# Patient Record
Sex: Female | Born: 1950 | Race: White | Hispanic: No | Marital: Married | Smoking: Former smoker
Health system: Southern US, Community
[De-identification: ages and names within clinical notes are randomized; demographics above are authoritative.]

## PROBLEM LIST (undated history)

## (undated) DIAGNOSIS — M1990 Unspecified osteoarthritis, unspecified site: Secondary | ICD-10-CM

## (undated) DIAGNOSIS — N189 Chronic kidney disease, unspecified: Secondary | ICD-10-CM

## (undated) DIAGNOSIS — R011 Cardiac murmur, unspecified: Secondary | ICD-10-CM

## (undated) DIAGNOSIS — E785 Hyperlipidemia, unspecified: Secondary | ICD-10-CM

## (undated) DIAGNOSIS — I10 Essential (primary) hypertension: Secondary | ICD-10-CM

## (undated) DIAGNOSIS — C801 Malignant (primary) neoplasm, unspecified: Secondary | ICD-10-CM

## (undated) DIAGNOSIS — M8580 Other specified disorders of bone density and structure, unspecified site: Secondary | ICD-10-CM

## (undated) DIAGNOSIS — J439 Emphysema, unspecified: Secondary | ICD-10-CM

## (undated) DIAGNOSIS — R0902 Hypoxemia: Secondary | ICD-10-CM

## (undated) DIAGNOSIS — T7840XA Allergy, unspecified, initial encounter: Secondary | ICD-10-CM

## (undated) DIAGNOSIS — K219 Gastro-esophageal reflux disease without esophagitis: Secondary | ICD-10-CM

## (undated) DIAGNOSIS — R0600 Dyspnea, unspecified: Secondary | ICD-10-CM

## (undated) HISTORY — DX: Hyperlipidemia, unspecified: E78.5

## (undated) HISTORY — DX: Chronic kidney disease, unspecified: N18.9

## (undated) HISTORY — DX: Allergy, unspecified, initial encounter: T78.40XA

## (undated) HISTORY — DX: Gastro-esophageal reflux disease without esophagitis: K21.9

## (undated) HISTORY — DX: Malignant (primary) neoplasm, unspecified: C80.1

## (undated) HISTORY — DX: Emphysema, unspecified: J43.9

## (undated) HISTORY — DX: Hypoxemia: R09.02

## (undated) HISTORY — DX: Dyspnea, unspecified: R06.00

## (undated) HISTORY — DX: Cardiac murmur, unspecified: R01.1

## (undated) HISTORY — DX: Other specified disorders of bone density and structure, unspecified site: M85.80

## (undated) HISTORY — DX: Unspecified osteoarthritis, unspecified site: M19.90

---

## 2004-11-05 DIAGNOSIS — C801 Malignant (primary) neoplasm, unspecified: Secondary | ICD-10-CM

## 2004-11-05 HISTORY — DX: Malignant (primary) neoplasm, unspecified: C80.1

## 2004-11-05 HISTORY — PX: NEPHRECTOMY: SHX65

## 2005-01-11 ENCOUNTER — Encounter: Admission: RE | Admit: 2005-01-11 | Discharge: 2005-01-11 | Payer: Self-pay | Admitting: Internal Medicine

## 2005-01-11 ENCOUNTER — Other Ambulatory Visit: Admission: RE | Admit: 2005-01-11 | Discharge: 2005-01-11 | Payer: Self-pay | Admitting: Internal Medicine

## 2005-05-07 ENCOUNTER — Ambulatory Visit: Payer: Self-pay | Admitting: Pulmonary Disease

## 2005-05-07 ENCOUNTER — Encounter: Payer: Self-pay | Admitting: Pulmonary Disease

## 2005-05-15 ENCOUNTER — Ambulatory Visit: Payer: Self-pay | Admitting: Pulmonary Disease

## 2005-05-15 ENCOUNTER — Encounter: Payer: Self-pay | Admitting: Pulmonary Disease

## 2005-05-16 ENCOUNTER — Ambulatory Visit: Payer: Self-pay

## 2005-05-30 ENCOUNTER — Ambulatory Visit: Payer: Self-pay | Admitting: Pulmonary Disease

## 2005-06-07 ENCOUNTER — Ambulatory Visit: Payer: Self-pay | Admitting: Internal Medicine

## 2005-06-12 ENCOUNTER — Ambulatory Visit: Payer: Self-pay | Admitting: Cardiology

## 2005-06-12 ENCOUNTER — Inpatient Hospital Stay (HOSPITAL_BASED_OUTPATIENT_CLINIC_OR_DEPARTMENT_OTHER): Admission: RE | Admit: 2005-06-12 | Discharge: 2005-06-12 | Payer: Self-pay | Admitting: Cardiology

## 2005-06-21 ENCOUNTER — Ambulatory Visit: Payer: Self-pay | Admitting: Internal Medicine

## 2005-07-19 ENCOUNTER — Ambulatory Visit: Payer: Self-pay | Admitting: Internal Medicine

## 2005-10-04 ENCOUNTER — Ambulatory Visit: Payer: Self-pay | Admitting: Internal Medicine

## 2005-10-18 ENCOUNTER — Ambulatory Visit: Payer: Self-pay | Admitting: Internal Medicine

## 2005-11-05 HISTORY — PX: URETHERAL RE-IMPLANTATION: SHX2616

## 2006-01-28 ENCOUNTER — Ambulatory Visit: Payer: Self-pay | Admitting: Internal Medicine

## 2006-10-03 ENCOUNTER — Other Ambulatory Visit: Admission: RE | Admit: 2006-10-03 | Discharge: 2006-10-03 | Payer: Self-pay | Admitting: Internal Medicine

## 2007-05-05 ENCOUNTER — Ambulatory Visit: Payer: Self-pay | Admitting: Cardiology

## 2007-05-08 ENCOUNTER — Ambulatory Visit: Admission: RE | Admit: 2007-05-08 | Discharge: 2007-05-08 | Payer: Self-pay | Admitting: Cardiology

## 2007-05-15 ENCOUNTER — Ambulatory Visit: Payer: Self-pay | Admitting: Pulmonary Disease

## 2007-05-22 ENCOUNTER — Ambulatory Visit: Payer: Self-pay | Admitting: Cardiology

## 2007-06-25 ENCOUNTER — Ambulatory Visit: Payer: Self-pay | Admitting: Cardiology

## 2007-07-10 ENCOUNTER — Encounter (HOSPITAL_COMMUNITY): Admission: RE | Admit: 2007-07-10 | Discharge: 2007-10-06 | Payer: Self-pay | Admitting: Pulmonary Disease

## 2007-07-18 ENCOUNTER — Ambulatory Visit: Payer: Self-pay | Admitting: Cardiology

## 2007-07-22 ENCOUNTER — Inpatient Hospital Stay (HOSPITAL_BASED_OUTPATIENT_CLINIC_OR_DEPARTMENT_OTHER): Admission: RE | Admit: 2007-07-22 | Discharge: 2007-07-22 | Payer: Self-pay | Admitting: Cardiology

## 2007-07-22 ENCOUNTER — Ambulatory Visit: Payer: Self-pay | Admitting: Cardiology

## 2007-07-23 ENCOUNTER — Ambulatory Visit: Payer: Self-pay | Admitting: Pulmonary Disease

## 2007-07-29 ENCOUNTER — Ambulatory Visit: Payer: Self-pay

## 2007-07-29 ENCOUNTER — Encounter: Payer: Self-pay | Admitting: Cardiology

## 2007-08-05 ENCOUNTER — Ambulatory Visit: Payer: Self-pay | Admitting: Pulmonary Disease

## 2007-08-05 LAB — CONVERTED CEMR LAB
BUN: 22 mg/dL (ref 6–23)
CO2: 28 meq/L (ref 19–32)
Calcium: 9.7 mg/dL (ref 8.4–10.5)
Chloride: 105 meq/L (ref 96–112)
Creatinine, Ser: 0.9 mg/dL (ref 0.4–1.2)
GFR calc Af Amer: 83 mL/min
GFR calc non Af Amer: 69 mL/min
Glucose, Bld: 98 mg/dL (ref 70–99)
Potassium: 4 meq/L (ref 3.5–5.1)
Sodium: 140 meq/L (ref 135–145)

## 2007-08-06 ENCOUNTER — Encounter: Payer: Self-pay | Admitting: Pulmonary Disease

## 2007-08-06 ENCOUNTER — Ambulatory Visit: Payer: Self-pay | Admitting: Cardiology

## 2007-08-13 ENCOUNTER — Encounter: Admission: RE | Admit: 2007-08-13 | Discharge: 2007-08-13 | Payer: Self-pay | Admitting: Internal Medicine

## 2007-08-15 ENCOUNTER — Encounter: Payer: Self-pay | Admitting: Endocrinology

## 2007-08-15 ENCOUNTER — Ambulatory Visit: Payer: Self-pay | Admitting: Endocrinology

## 2007-08-15 DIAGNOSIS — E042 Nontoxic multinodular goiter: Secondary | ICD-10-CM | POA: Insufficient documentation

## 2007-08-17 DIAGNOSIS — E785 Hyperlipidemia, unspecified: Secondary | ICD-10-CM | POA: Insufficient documentation

## 2007-08-17 DIAGNOSIS — J309 Allergic rhinitis, unspecified: Secondary | ICD-10-CM | POA: Insufficient documentation

## 2007-08-17 DIAGNOSIS — I10 Essential (primary) hypertension: Secondary | ICD-10-CM | POA: Insufficient documentation

## 2007-10-07 ENCOUNTER — Encounter (HOSPITAL_COMMUNITY): Admission: RE | Admit: 2007-10-07 | Discharge: 2007-11-04 | Payer: Self-pay | Admitting: Pulmonary Disease

## 2007-10-28 ENCOUNTER — Telehealth: Payer: Self-pay | Admitting: Pulmonary Disease

## 2007-11-04 ENCOUNTER — Encounter: Payer: Self-pay | Admitting: Pulmonary Disease

## 2007-11-06 ENCOUNTER — Encounter (HOSPITAL_COMMUNITY): Admission: RE | Admit: 2007-11-06 | Discharge: 2007-12-05 | Payer: Self-pay | Admitting: Pulmonary Disease

## 2007-11-10 ENCOUNTER — Telehealth (INDEPENDENT_AMBULATORY_CARE_PROVIDER_SITE_OTHER): Payer: Self-pay | Admitting: *Deleted

## 2007-11-12 ENCOUNTER — Telehealth: Payer: Self-pay | Admitting: Pulmonary Disease

## 2007-12-18 ENCOUNTER — Ambulatory Visit: Payer: Self-pay

## 2008-05-28 ENCOUNTER — Other Ambulatory Visit: Admission: RE | Admit: 2008-05-28 | Discharge: 2008-05-28 | Payer: Self-pay | Admitting: Internal Medicine

## 2008-08-16 ENCOUNTER — Ambulatory Visit: Payer: Self-pay | Admitting: Internal Medicine

## 2008-09-02 ENCOUNTER — Ambulatory Visit: Payer: Self-pay | Admitting: Cardiology

## 2008-10-07 ENCOUNTER — Ambulatory Visit: Payer: Self-pay | Admitting: Internal Medicine

## 2008-12-06 ENCOUNTER — Ambulatory Visit: Payer: Self-pay | Admitting: Internal Medicine

## 2009-01-31 DIAGNOSIS — I447 Left bundle-branch block, unspecified: Secondary | ICD-10-CM | POA: Insufficient documentation

## 2009-01-31 DIAGNOSIS — J439 Emphysema, unspecified: Secondary | ICD-10-CM | POA: Insufficient documentation

## 2009-01-31 DIAGNOSIS — I2510 Atherosclerotic heart disease of native coronary artery without angina pectoris: Secondary | ICD-10-CM | POA: Insufficient documentation

## 2009-04-11 ENCOUNTER — Encounter (INDEPENDENT_AMBULATORY_CARE_PROVIDER_SITE_OTHER): Payer: Self-pay | Admitting: *Deleted

## 2009-04-21 ENCOUNTER — Ambulatory Visit: Payer: Self-pay | Admitting: Cardiology

## 2009-06-02 ENCOUNTER — Ambulatory Visit: Payer: Self-pay | Admitting: Internal Medicine

## 2009-06-02 ENCOUNTER — Encounter: Payer: Self-pay | Admitting: Cardiology

## 2009-06-02 ENCOUNTER — Other Ambulatory Visit: Admission: RE | Admit: 2009-06-02 | Discharge: 2009-06-02 | Payer: Self-pay | Admitting: Internal Medicine

## 2009-06-10 ENCOUNTER — Ambulatory Visit: Payer: Self-pay | Admitting: Internal Medicine

## 2009-07-08 ENCOUNTER — Ambulatory Visit: Payer: Self-pay | Admitting: Internal Medicine

## 2009-09-13 ENCOUNTER — Ambulatory Visit: Payer: Self-pay | Admitting: Internal Medicine

## 2009-12-02 ENCOUNTER — Ambulatory Visit: Payer: Self-pay | Admitting: Internal Medicine

## 2010-01-09 ENCOUNTER — Ambulatory Visit: Payer: Self-pay | Admitting: Internal Medicine

## 2010-01-11 ENCOUNTER — Encounter: Admission: RE | Admit: 2010-01-11 | Discharge: 2010-01-11 | Payer: Self-pay | Admitting: Internal Medicine

## 2010-04-13 ENCOUNTER — Ambulatory Visit: Payer: Self-pay | Admitting: Cardiology

## 2010-06-02 ENCOUNTER — Ambulatory Visit: Payer: Self-pay | Admitting: Internal Medicine

## 2010-06-20 ENCOUNTER — Ambulatory Visit: Payer: Self-pay | Admitting: Internal Medicine

## 2010-09-07 ENCOUNTER — Ambulatory Visit: Payer: Self-pay | Admitting: Internal Medicine

## 2010-10-19 ENCOUNTER — Ambulatory Visit: Payer: Self-pay | Admitting: Internal Medicine

## 2010-12-04 ENCOUNTER — Ambulatory Visit: Admit: 2010-12-04 | Payer: Self-pay | Admitting: Internal Medicine

## 2010-12-04 ENCOUNTER — Ambulatory Visit
Admission: RE | Admit: 2010-12-04 | Discharge: 2010-12-04 | Payer: Self-pay | Source: Home / Self Care | Attending: Internal Medicine | Admitting: Internal Medicine

## 2010-12-05 NOTE — Assessment & Plan Note (Signed)
Summary: [name]   Visit Type:  1 yr f/u Referring Provider:  baxley Primary Provider:  [name] Baxely  CC:  sob...edema/feet....denies any cp.  History of Present Illness: Doing well.  No chest pain.  When she returned from her last trip, he legs and feet swelled, and it occurred on both sides.  The swelling finally went away.  Both legs got someone hot, the right leg worse than the left.  This occured about the end of May.  She did walk around the plan.  She does take her exercise bands in her seat to prevent trouble.  No chest pain with any of this.    Current Medications (verified): 1)  Atenolol 25 Mg Tabs (Atenolol) .... Take 1 Tablet By Mouth Once A Day 2)  Spiriva Handihaler 18 Mcg  Caps (Tiotropium Bromide Monohydrate) .... Inhale Contents of 1 Capsule Once A Day 3)  Hydrochlorothiazide 12.5 Mg Tabs (Hydrochlorothiazide) .... Take One Tablet By Mouth Daily. 4)  Excedrin Extra Strength 250-250-65 Mg Tabs (Aspirin-Acetaminophen-Caffeine) .... Take 1 Tablet By Mouth Once A Day 5)  Trimethoprim 100 Mg Tabs (Trimethoprim) .... Take 1 Tablet By Mouth Once A Day 6)  Nasacort Aq 55 Mcg/act Aers (Triamcinolone Acetonide(Nasal)) .... Once A Day 7)  Advair Diskus 250-50 Mcg/dose Misc (Fluticasone-Salmeterol) .... One Puff Two Times A Day 8)  Zyrtec Hives Relief 10 Mg Tabs (Cetirizine Hcl) .... Take 1 Tablet By Mouth Once A Day 9)  Valtrex 500 Mg Tabs (Valacyclovir Hcl) .... Take 1 Tablet By Mouth Once A Day At Bedtime 10)  Coleus Forskalin .... Take 1 Capsule By Mouth Three Times A Day 11)  Vagifen Supposit. .... Every 3 Days 12)  Crestor 20 Mg Tabs (Rosuvastatin Calcium) .... Take One Tablet By Mouth Daily. 13)  Promethazine Hcl 25 Mg Tabs (Promethazine Hcl) .... As Needed 14)  Mucinex Dm 30-600 Mg Xr12h-Tab (Dextromethorphan-Guaifenesin) .... As Needed 15)  Nitroglycerin 0.4 Mg Subl (Nitroglycerin) .... One Tablet Under Tongue Every 5 Minutes As Needed For Chest Pain---May Repeat Times  Three 16)  Levaquin 500 Mg Tabs (Levofloxacin) .... As Needed 17)  Benadryl 50 Mg/ml Soln (Diphenhydramine Hcl) .... As Needed 18)  Glycolax  Powd (Polyethylene Glycol 3350) .... 1/2 -1 Cap Once Daily  Allergies: No Known Drug Allergies  Past History:  Past Medical History: Last updated: 01/31/2009 CHEST PAIN-UNSPECIFIED (ICD-786.50) DYSPNEA (ICD-786.05) LBBB (ICD-426.3) CAD, UNSPECIFIED SITE (ICD-414.00) HYPERTENSION (ICD-401.9) HYPERLIPIDEMIA (ICD-272.4) HEADACHE (ICD-784.0) GOITER, NONTOXIC MULTINODULAR (ICD-241.1) COPD (ICD-496) ASTHMA (ICD-493.90) ALLERGIC RHINITIS (ICD-477.9)    Family History: Last updated: 01/31/2009 mother: hypothyroid Family History of Cancer:   Social History: Last updated: 01/31/2009 retired married Tobacco Use - No.  Alcohol Use - yes Regular Exercise - yes Drug Use - no  Vital Signs:  Patient profile:   60 year old female Height:      63 inches Weight:      154 pounds BMI:     27.38 Pulse rate:   66 / minute Pulse rhythm:   irregular BP sitting:   148 / 86  (left arm) Cuff size:   large  Vitals Entered By: Danielle Rankin, CMA (April 13, 2010 10:13 AM)  Physical Exam  General:  Well developed, well nourished, in no acute distress. Head:  normocephalic and atraumatic Eyes:  PERRLA/EOM intact; conjunctiva and lids normal. Lungs:  Clear bilaterally to auscultation and percussion. Heart:  PMI non displaced.  Normal S1.  Paradoxic S2.  No DM. Pulses:  pulses normal in all 4  extremities Extremities:  Trace edema.  No evidence of DVT. Neurologic:  Alert and oriented x 3.   Cardiac Cath  Procedure date:  07/22/2007  Findings:       ANGIOGRAPHIC DATA.:   1. Ventriculography in the RAO projection reveals vigorous global       systolic function.  Ejection fraction estimated in the range of       60%.   2. The right coronary is a small nondominant vessel.  It does not       appear to have significant focal obstruction.   3. The  left main coronary artery is free of critical disease.  It       divides into a large left anterior descending artery and circumflex       vessel.   4. The left anterior descending artery has some mild proximal luminal       irregularity.  This is perhaps slightly less prominent than on the       previous study.  There is a small first diagonal branch that has       perhaps some mild ostial narrowing, but again compared to the       previous study seems to be less prominent.  There is a mid area of       stenosis in the LAD.  This measures about 30-40% luminal reduction.       It does not appear to be critical.   5. The circumflex is a dominant vessel.  It tapers proximally into the       first marginal branch.  The ostium of the first marginal branch has       about 40% narrowing.  It does not appear to be flow limiting, but       clearly demonstrates eccentric plaquing proximally.  This distal       vessel is consistent with a bifurcating marginal and is free of       critical disease. Distally, the circumflex then provides a       posterolateral branch. The posterolateral branch does not have       significant focal narrowing.  The mid portion of the posterior       descending branch probably has about 40-50% narrowing as well, and       this is best seen in the spider view but again does not appear to       be critical.      CONCLUSION:   1. Preserved left ventricular function.   2. Mild plaquing in the mid left anterior descending.   3. Mild to moderate stenosis of the proximal marginal branch and mild       plaquing of the posterior descending branch.      DISPOSITION:   EKG  Procedure date:  04/13/2010  Findings:      NSR.  IVCD. LBBB morphology.  Impression & Recommendations:  Problem # 1:  CHEST PAIN-UNSPECIFIED (ICD-786.50) No recurrence.  Doing well.  Problem # 2:  HYPERLIPIDEMIA (ICD-272.4) Followed by Dr. [name]. Her updated medication list for this problem  includes:    Crestor 20 Mg Tabs (Rosuvastatin calcium) .Marland Kitchen... Take one tablet by mouth daily.  Problem # 3:  LBBB (ICD-426.3) Chronic  Problem # 4:  HYPERTENSION (ICD-401.9) Control may be variable. Scheduled to see Dr. [name] in next couple of months for physicial.  Asked her to record BPs every other day, and take them in at the appointment.  May want  to revisit use of atenolol.   The following medications were removed from the medication list:    Atenolol 25 Mg Tabs (Atenolol) .Marland Kitchen... Take 1 tablet by mouth once a day Her updated medication list for this problem includes:    Atenolol 25 Mg Tabs (Atenolol) .Marland Kitchen... Take 1 tablet by mouth once a day    Hydrochlorothiazide 12.5 Mg Tabs (Hydrochlorothiazide) .Marland Kitchen... Take one tablet by mouth daily.  Patient Instructions: 1)  Your physician recommends that you schedule a follow-up appointment in: one year

## 2010-12-07 ENCOUNTER — Ambulatory Visit: Payer: Self-pay | Admitting: Internal Medicine

## 2010-12-25 ENCOUNTER — Ambulatory Visit (INDEPENDENT_AMBULATORY_CARE_PROVIDER_SITE_OTHER): Payer: BC Managed Care – PPO | Admitting: Internal Medicine

## 2010-12-25 DIAGNOSIS — J209 Acute bronchitis, unspecified: Secondary | ICD-10-CM

## 2010-12-28 ENCOUNTER — Inpatient Hospital Stay (HOSPITAL_COMMUNITY): Payer: BC Managed Care – PPO

## 2010-12-28 ENCOUNTER — Ambulatory Visit: Payer: BC Managed Care – PPO | Admitting: Internal Medicine

## 2010-12-28 ENCOUNTER — Inpatient Hospital Stay (HOSPITAL_COMMUNITY)
Admission: AD | Admit: 2010-12-28 | Discharge: 2011-01-05 | DRG: 088 | Disposition: A | Discharge status: Home Health | Payer: BC Managed Care – PPO | Source: Ambulatory Visit | Attending: Internal Medicine | Admitting: Internal Medicine

## 2010-12-28 DIAGNOSIS — Z87440 Personal history of urinary (tract) infections: Secondary | ICD-10-CM

## 2010-12-28 DIAGNOSIS — J988 Other specified respiratory disorders: Secondary | ICD-10-CM | POA: Diagnosis present

## 2010-12-28 DIAGNOSIS — J209 Acute bronchitis, unspecified: Principal | ICD-10-CM | POA: Diagnosis present

## 2010-12-28 DIAGNOSIS — N390 Urinary tract infection, site not specified: Secondary | ICD-10-CM

## 2010-12-28 DIAGNOSIS — B9789 Other viral agents as the cause of diseases classified elsewhere: Secondary | ICD-10-CM

## 2010-12-28 DIAGNOSIS — J449 Chronic obstructive pulmonary disease, unspecified: Secondary | ICD-10-CM

## 2010-12-28 DIAGNOSIS — J440 Chronic obstructive pulmonary disease with acute lower respiratory infection: Secondary | ICD-10-CM

## 2010-12-28 DIAGNOSIS — R0609 Other forms of dyspnea: Secondary | ICD-10-CM

## 2010-12-28 DIAGNOSIS — Z85528 Personal history of other malignant neoplasm of kidney: Secondary | ICD-10-CM

## 2010-12-28 DIAGNOSIS — R0989 Other specified symptoms and signs involving the circulatory and respiratory systems: Secondary | ICD-10-CM

## 2010-12-28 DIAGNOSIS — I2510 Atherosclerotic heart disease of native coronary artery without angina pectoris: Secondary | ICD-10-CM | POA: Diagnosis present

## 2010-12-28 DIAGNOSIS — Z905 Acquired absence of kidney: Secondary | ICD-10-CM

## 2010-12-28 DIAGNOSIS — I10 Essential (primary) hypertension: Secondary | ICD-10-CM | POA: Diagnosis present

## 2010-12-28 DIAGNOSIS — E785 Hyperlipidemia, unspecified: Secondary | ICD-10-CM | POA: Diagnosis present

## 2010-12-28 DIAGNOSIS — A498 Other bacterial infections of unspecified site: Secondary | ICD-10-CM | POA: Diagnosis present

## 2010-12-28 DIAGNOSIS — Z87891 Personal history of nicotine dependence: Secondary | ICD-10-CM

## 2010-12-28 LAB — CBC
HCT: 44.5 % (ref 36.0–46.0)
Hemoglobin: 14.9 g/dL (ref 12.0–15.0)
MCH: 33 pg (ref 26.0–34.0)
MCHC: 33.5 g/dL (ref 30.0–36.0)
MCV: 98.7 fL (ref 78.0–100.0)
Platelets: 141 10*3/uL — ABNORMAL LOW (ref 150–400)
RBC: 4.51 MIL/uL (ref 3.87–5.11)
RDW: 12.8 % (ref 11.5–15.5)
WBC: 6.4 10*3/uL (ref 4.0–10.5)

## 2010-12-28 LAB — COMPREHENSIVE METABOLIC PANEL
ALT: 37 U/L — ABNORMAL HIGH (ref 0–35)
AST: 34 U/L (ref 0–37)
Albumin: 3.6 g/dL (ref 3.5–5.2)
Alkaline Phosphatase: 71 U/L (ref 39–117)
BUN: 11 mg/dL (ref 6–23)
CO2: 22 mEq/L (ref 19–32)
Calcium: 8.7 mg/dL (ref 8.4–10.5)
Chloride: 105 mEq/L (ref 96–112)
Creatinine, Ser: 0.82 mg/dL (ref 0.4–1.2)
GFR calc Af Amer: >60 mL/min (ref >60)
GFR calc non Af Amer: >60 mL/min (ref >60)
Glucose, Bld: 104 mg/dL — ABNORMAL HIGH (ref 70–99)
Potassium: 3.8 mEq/L (ref 3.5–5.1)
Sodium: 135 mEq/L (ref 135–145)
Total Bilirubin: 0.6 mg/dL (ref 0.3–1.2)
Total Protein: 6.8 g/dL (ref 6.0–8.3)

## 2010-12-28 LAB — DIFFERENTIAL
Basophils Absolute: 0 10*3/uL (ref 0.0–0.1)
Basophils Relative: 0 % (ref 0–1)
Eosinophils Absolute: 0 10*3/uL (ref 0.0–0.7)
Eosinophils Relative: 0 % (ref 0–5)
Lymphocytes Relative: 12 % (ref 12–46)
Lymphs Abs: 0.8 10*3/uL (ref 0.7–4.0)
Monocytes Absolute: 0.7 10*3/uL (ref 0.1–1.0)
Monocytes Relative: 11 % (ref 3–12)
Neutro Abs: 4.9 10*3/uL (ref 1.7–7.7)
Neutrophils Relative %: 77 % (ref 43–77)

## 2010-12-28 LAB — TSH: TSH: 0.596 u[IU]/mL (ref 0.350–4.500)

## 2010-12-29 LAB — GLUCOSE, CAPILLARY
Glucose-Capillary: 187 mg/dL — ABNORMAL HIGH (ref 70–99)
Glucose-Capillary: 206 mg/dL — ABNORMAL HIGH (ref 70–99)
Glucose-Capillary: 170 mg/dL — ABNORMAL HIGH (ref 70–99)
Glucose-Capillary: 235 mg/dL — ABNORMAL HIGH (ref 70–99)

## 2010-12-29 LAB — INFLUENZA PANEL BY PCR (TYPE A & B)
H1N1 flu by pcr: NOT DETECTED
Influenza A By PCR: NEGATIVE
Influenza B By PCR: NEGATIVE

## 2010-12-30 LAB — GLUCOSE, CAPILLARY
Glucose-Capillary: 152 mg/dL — ABNORMAL HIGH (ref 70–99)
Glucose-Capillary: 119 mg/dL — ABNORMAL HIGH (ref 70–99)
Glucose-Capillary: 174 mg/dL — ABNORMAL HIGH (ref 70–99)

## 2010-12-31 ENCOUNTER — Inpatient Hospital Stay (HOSPITAL_COMMUNITY): Payer: BC Managed Care – PPO

## 2010-12-31 LAB — GLUCOSE, CAPILLARY
Glucose-Capillary: 152 mg/dL — ABNORMAL HIGH (ref 70–99)
Glucose-Capillary: 183 mg/dL — ABNORMAL HIGH (ref 70–99)
Glucose-Capillary: 155 mg/dL — ABNORMAL HIGH (ref 70–99)

## 2011-01-01 LAB — GLUCOSE, CAPILLARY
Glucose-Capillary: 130 mg/dL — ABNORMAL HIGH (ref 70–99)
Glucose-Capillary: 121 mg/dL — ABNORMAL HIGH (ref 70–99)
Glucose-Capillary: 167 mg/dL — ABNORMAL HIGH (ref 70–99)

## 2011-01-01 LAB — URINE MICROSCOPIC-ADD ON

## 2011-01-01 LAB — URINALYSIS, ROUTINE W REFLEX MICROSCOPIC
Bilirubin Urine: NEGATIVE
Ketones, ur: NEGATIVE mg/dL
Nitrite: POSITIVE — AB
Protein, ur: NEGATIVE mg/dL
Specific Gravity, Urine: 1.009 (ref 1.005–1.030)
Urine Glucose, Fasting: NEGATIVE mg/dL
Urobilinogen, UA: 0.2 mg/dL (ref 0.0–1.0)
pH: 5.5 (ref 5.0–8.0)

## 2011-01-02 DIAGNOSIS — J441 Chronic obstructive pulmonary disease with (acute) exacerbation: Secondary | ICD-10-CM

## 2011-01-02 LAB — GLUCOSE, CAPILLARY
Glucose-Capillary: 146 mg/dL — ABNORMAL HIGH (ref 70–99)
Glucose-Capillary: 136 mg/dL — ABNORMAL HIGH (ref 70–99)
Glucose-Capillary: 192 mg/dL — ABNORMAL HIGH (ref 70–99)

## 2011-01-03 DIAGNOSIS — J441 Chronic obstructive pulmonary disease with (acute) exacerbation: Secondary | ICD-10-CM

## 2011-01-03 DIAGNOSIS — Z1635 Resistance to multiple antimicrobial drugs: Secondary | ICD-10-CM

## 2011-01-03 DIAGNOSIS — N390 Urinary tract infection, site not specified: Secondary | ICD-10-CM

## 2011-01-03 LAB — URINALYSIS, ROUTINE W REFLEX MICROSCOPIC
Bilirubin Urine: NEGATIVE
Hgb urine dipstick: NEGATIVE
Ketones, ur: NEGATIVE mg/dL
Nitrite: NEGATIVE
Protein, ur: NEGATIVE mg/dL
Specific Gravity, Urine: 1.007 (ref 1.005–1.030)
Urine Glucose, Fasting: NEGATIVE mg/dL
Urobilinogen, UA: 0.2 mg/dL (ref 0.0–1.0)
pH: 6 (ref 5.0–8.0)

## 2011-01-03 LAB — URINE CULTURE
Colony Count: >=100000
Culture  Setup Time: 201202271035
Special Requests: NEGATIVE

## 2011-01-03 LAB — GLUCOSE, CAPILLARY
Glucose-Capillary: 112 mg/dL — ABNORMAL HIGH (ref 70–99)
Glucose-Capillary: 105 mg/dL — ABNORMAL HIGH (ref 70–99)
Glucose-Capillary: 189 mg/dL — ABNORMAL HIGH (ref 70–99)

## 2011-01-03 LAB — URINE MICROSCOPIC-ADD ON

## 2011-01-03 NOTE — Consult Note (Signed)
[name], [name]                ACCOUNT NO.:  [id number]  MEDICAL RECORD NO.:  [id number]           PATIENT TYPE:  I  LOCATION:  1319                         FACILITY:  [facility]  PHYSICIAN:  Oretha Milch, MD      DATE OF BIRTH:  [birth year]  DATE OF CONSULTATION:  12/28/2010 DATE OF DISCHARGE:                                CONSULTATION   REASON FOR CONSULTATION:  COPD exacerbation.  HISTORY OF PRESENT ILLNESS:  Ms. [name] is a pleasant 60 year old ex- smoker with known COPD.  She was seen by Dr. Shelle Iron in July 2008.  PFTs at that time showed an FEV1 of 1.61 with the ratio suggesting moderate airway obstruction and a moderately reduced DLCO consistent with emphysema.  She has been maintained on a regimen of Advair and Spiriva since then.  She smoked about 35 pack years and quit in 2000.  She is a frequent traveler and has just returned from [place] a week ago.  She took a cruise from [place].  After her return, she developed URI symptoms with a runny nose and sore throat and was seen by Dr. [name] 4 days ago.  She was afebrile and given p.o. Avelox.  She returned today with worsening dyspnea, was febrile to 101.2 and pulse ox of 92%.  She was admitted for a COPD flare and started an IV Solu-Medrol and antibiotics.  We are asked to give our input regarding her management.  She states her dyspnea is mildly improved, but she has had worsening dyspnea over the past few months.  She states that she lives in two- level house and cannot climb the stairs without being dyspneic.  She denies chest pain or palpitations.  Her cough has been productive of creamy phlegm.  She reports wheezing over the last 3 days.  She has paroxysms of coughing followed by increased wheezing.  Cough is somewhat improved by Mucinex DM and drinking water.  PAST MEDICAL HISTORY:  Includes: 1. Hypertension. 2. Coronary artery disease with normal EF followed by Dr. [name]. 3. Renal cell cancer, status post  partial left nephrectomy with     ureteral replantation. 4. Chronic headaches. 5. Allergic rhinitis.  PAST SURGICAL HISTORY:  Includes tubal ligation and partial left nephrectomy.  CURRENT MEDICATIONS:  Include: 1. Advair 250/50 one puff b.i.d. 2. Spiriva HandiHaler daily. 3. Fish oil. 4. Coenzyme Q. 5. Vagifem 25 mg every 3 days. 6. Macrobid daily for frequent UTIs. 7. Zyrtec 10 mg p.o. daily. 8. Nasacort one spray each naris daily. 9. Excedrin 2 tablets 3 times a day p.r.n. 10.Hydrochlorothiazide 25 mg p.o. daily. 11.Valtrex 100 mg p.o. q.a.m. 12.Crestor 20 mg p.o. q.a.m. 13.Tylenol 25 mg p.o. at bedtime.  ALLERGIES:  None known.  SOCIAL HISTORY:  She is married, with children.  She is retired, lives with her husband.  She used to smoke about a pack a day, quit in 2000, about 35 pack years.  Grandmother had had emphysema and never smoked. History of alcohol use.  FAMILY HISTORY:  Parents have heart disease.  Father had ENT cancer.  REVIEW OF SYSTEMS:  As described above.  Positive for dyspnea, cough, and wheezing.  No history of chest pain, palpitations.  No nausea, vomiting, diarrhea.  History of fevers and generalized weakness.  No dizziness, loss of hearing, or focal weakness.  Other systems were reviewed and negative.  PHYSICAL EXAMINATION:  GENERAL:  Adult woman sitting up in bed in mild respiratory distress. VITAL SIGNS:  Oxygen saturation 99% on 2 liters nasal cannula, 94% on room air.  Respirations 18 per minute, heart rate 98 per minute, blood pressure 148/90. NECK:  Supple.  No JVD.  No lymphadenopathy.  No pallor.  No icterus. CV:  S1 and S2 normal. CHEST:  Decreased breath sounds bilaterally.  Faint expiratory rhonchi. ABDOMEN:  Soft, nontender. NEUROLOGIC:  Nonfocal. EXTREMITIES:  No edema.  LABORATORY DATA:  WBC count 6.4, hemoglobin 14.9, platelets 141, 77% neutrophils.  Chemistry:  Sodium 135, potassium 3.8, bicarbonate 22, chloride 105, BUN and  creatinine 11 and 0.8.  Bilirubin 0.6, SGOT and SGPT 34 and 37, albumin 15.6.  Chest x-ray was reviewed and shows a peribronchial thickening without any clear infiltrate.  IMPRESSION: 1. Chronic obstructive pulmonary disease exacerbation, likely related     to upper respiratory illness. 2. She did take the flu shot and the Pneumovax. 3. Underlying worsening of dyspnea.  Given alternative cause of     dyspnea and presence of fever and bronchospasm, the clinical     probability of pulmonary embolism is low, and I doubt further     testing is necessary in that direction.  I do note worsening of her     dyspnea over the last few months, which may be simply related to     bursting of chronic obstructive pulmonary disease.  If her dyspnea     persists after bronchospasm is resolved, we may have to investigate     the possibility of chronic pulmonary embolism in this frequent     traveler.  RECOMMENDATIONS: 1. IV Solu-Medrol 80 q.8. 2. IV Avelox can be switched to oral in 24 hours. 3. Mucolytic okay for now.  Cough, we can use a cough suppressant. 4. Check a rapid flu test. 5. I would recommend stopping Macrodantin due to pulmonary toxicity if     no alternative is available.  I understand that frequent urinary     tract infections have been a problem.  We will defer this to Dr.     [name]. 6. Advair and Spiriva can be continued as outpatient.  Albuterol nebs     to be used while in the hospital.     Oretha Milch, MD     RVA/MEDQ  D:  12/28/2010  T:  12/29/2010  Job:  [id number]  cc:   [name] [name], M.D.  Electronically Signed by Cyril Mourning MD on 01/03/2011 05:29:10 PM

## 2011-01-04 LAB — BASIC METABOLIC PANEL
BUN: 16 mg/dL (ref 6–23)
CO2: 29 mEq/L (ref 19–32)
Calcium: 9.2 mg/dL (ref 8.4–10.5)
Chloride: 100 mEq/L (ref 96–112)
Creatinine, Ser: 0.8 mg/dL (ref 0.4–1.2)
GFR calc Af Amer: >60 mL/min (ref >60)
GFR calc non Af Amer: >60 mL/min (ref >60)
Glucose, Bld: 120 mg/dL — ABNORMAL HIGH (ref 70–99)
Potassium: 3.8 mEq/L (ref 3.5–5.1)
Sodium: 138 mEq/L (ref 135–145)

## 2011-01-04 LAB — GLUCOSE, CAPILLARY
Glucose-Capillary: 102 mg/dL — ABNORMAL HIGH (ref 70–99)
Glucose-Capillary: 126 mg/dL — ABNORMAL HIGH (ref 70–99)
Glucose-Capillary: 181 mg/dL — ABNORMAL HIGH (ref 70–99)

## 2011-01-05 LAB — GLUCOSE, CAPILLARY: Glucose-Capillary: 114 mg/dL — ABNORMAL HIGH (ref 70–99)
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
## 2011-01-14 NOTE — Discharge Summary (Signed)
[name], [name]                ACCOUNT NO.:  [id number]  MEDICAL RECORD NO.:  [id number]           PATIENT TYPE:  I  LOCATION:  1319                         FACILITY:  [facility]  PHYSICIAN:  [name] [name], M.D.   DATE OF BIRTH:  Apr 20, 1951  DATE OF ADMISSION:  12/28/2010 DATE OF DISCHARGE:  01/05/2011                              DISCHARGE SUMMARY   FINAL DIAGNOSES: 1. Acute bronchitis secondary to viral respiratory infection. 2. Exacerbation of chronic obstructive pulmonary disease secondary to     acute bronchitis secondary to viral respiratory infection. 3. Beta-lactam producing Escherichia coli urinary tract infection. 4. Hypertension. 5. Hyperlipidemia. 6. Coronary artery disease.  CONSULTATIONS: 1. [facility] Coralyn Helling, M.D. and Barbaraann Share, M.D.,     [facility]). 2. Infectious Disease Fransisco Hertz, M.D.).  BRIEF HISTORY:  This 60 year old white female with a history of moderate COPD, coronary artery disease, hypertension, recurrent urinary tract infections, history of renal cell carcinoma returned from [place] the week prior to admission and came down with a respiratory infection over the weekend of February 18.  She was seen in the office December 25, 2010, and was placed on oral Avelox.  She had no respiratory distress at that time.  She called the office on February 23 complaining of extreme respiratory distress and fever.  Temperature was 101.2 degrees orally, pulse 101 and regular, pulse oximetry on room air was 92%.  She was not moving air well at all on chest examination and had minimal improvement with nebulizer treatment.  She had a congested cough.  She was admitted with a diagnosis of COPD exacerbation with respiratory infection and possible pneumonia.  For past medical history, family history and social history please see dictated history and physical.  HOSPITAL COURSE:  The patient was admitted to a regular medical floor and started on IV  fluids consisting of D5 one-half normal saline at 75 cc per hour.  She was given Solu-Medrol 80 mg IV q.8 hours.  She was given nebulizer treatments consisting of albuterol and Atrovent q.i.d. However, she complained that these caused headaches and subsequently she was switched to Xopenex.  The patient complained of UTI symptoms a couple of days into admission.  Clean catch urine specimen was obtained and culture grew greater than 100,000 col/ml ESBL (Escherichia coli) with intermediate sensitivity to nitrofurantoin 1:32 and very good sensitivity to imipenem.  The patient was subsequently started on Primaxin 500 mg IV q.8 hours.  Infectious Disease consultation was obtained with Dr. Lina Sayre because of her history of many urinary tract infections status post partial nephrectomy for a large septated cyst that apparently had a focus of renal cell carcinoma in 2007.  At that time she also had a reimplantation of her left ureter.  She had been on prophylactic therapy with nitrofurantoin 50 mg h.s. with good results over the past couple of months or so.  Dr. Maurice March felt that we needed to treat fairly aggressively with Primaxin.  We wanted to send her home so we switched to Invanz the day prior to discharge and planned to continue that for 7 days total  through [facility].  She was seen in consultation by pulmonologist Dr. Craige Cotta and Dr. Shelle Iron.  They recognized her degree of respiratory distress.  She was maintained on Solu-Medrol for several days and was tapered slowly and subsequently begun on prednisone 40 mg daily with a slow taper over the next 3 weeks. Advair was increased from 250/50 to 500/50.  We spoke with the patient's urologist in [facility] Dr. Coralyn Pear.  We have agreed to discontinue her prophylaxis for UTI at this point in time and she is aware of the resistant strain of Escherichia coli that the patient has at the present time.  The patient will be restarted on  Spiriva HandiHaler 1 capsule inhaled daily in addition to Advair.  Prednisone will be tapered over the course of 3 weeks going from 40 mg q.3 days, 30 mg q.3 days, et Karie Soda.  The patient was sent home on home oxygen at 2 liters nasal prong.  When she got up and walked about the room, her pulse oximetry dropped to 88% on room air, which was concerning.  She may need to take this on her trips in the near future for excursions on cruises, et cetera.  The patient was started here initially on IV Avelox, but subsequently changed to Primaxin and then Invanz at the latter stages of her hospitalization.  She defervesced very quickly after IV antibiotics were started.  She did well during her hospitalization, but was slow to turn around with regard to respiratory distress.  This was concerning her because, I believe, she has never had this degree of respiratory difficulty in the past.  However, by January 05, 2011, she was in much improved condition.  She was discharged home with [facility] to provide IV antibiotic Invanz 1 gram daily from Saturday, March 3 for 5 days, 1 gram daily.  During her hospitalization her blood pressure increased to 160/90 and she was started on Cozaar 50 mg daily, which will be continued as an outpatient.  HCTZ was increased from 12.5 mg to 25 mg daily.  Atenolol was continued.  She will follow up with Dr. Shelle Iron in 3 weeks and will submit another urine specimen to my office on March 12.  DISCHARGE MEDICATIONS: 1. Atenolol 25 mg daily. 2. HCTZ 25 mg daily. 3. Spiriva HandiHaler contents of one capsule inhaled daily. 4. Nasacort two sprays in each nostril daily. 5. Crestor 20 mg daily. 6. Valtrex 500 mg daily for prophylaxis for herpes simplex. 7. Zyrtec 10 mg daily. 8. Fiorinal one or two p.o. q.4-6 hours p.r.n. migraine headache, not     to exceed 6 tablets in 24 hours. 9. MiraLax daily for constipation. 10.Vagifem suppositories q.3 days for vaginal  atrophy. 11.Advair 500/50 one spray p.o. q.12 hours p.r.n. 12.Nitroglycerin 1/150 sublingual p.r.n. chest pain. 13.Excedrin one tablet daily. 14.Ambien 10 mg h.s. 15.Mucinex DM 600 mg twice a day. 16.Xopenex nebulizer solution to use at home q.i.d. p.r.n. 17.Protonix 40 mg daily for GE reflux.     [name] [name], M.D.     MJB/MEDQ  D:  01/08/2011  T:  01/08/2011  Job:  [id number]  Electronically Signed by Marlan Palau M.D. on 01/14/2011 07:55:00 PM

## 2011-01-17 ENCOUNTER — Inpatient Hospital Stay (HOSPITAL_COMMUNITY)
Admission: AD | Admit: 2011-01-17 | Discharge: 2011-01-26 | DRG: 320 | Disposition: A | Discharge status: Home / Self Care | Payer: BC Managed Care – PPO | Source: Ambulatory Visit | Attending: Internal Medicine | Admitting: Internal Medicine

## 2011-01-17 ENCOUNTER — Ambulatory Visit: Payer: BC Managed Care – PPO | Admitting: Internal Medicine

## 2011-01-17 DIAGNOSIS — J449 Chronic obstructive pulmonary disease, unspecified: Secondary | ICD-10-CM | POA: Diagnosis present

## 2011-01-17 DIAGNOSIS — N390 Urinary tract infection, site not specified: Principal | ICD-10-CM | POA: Diagnosis present

## 2011-01-17 DIAGNOSIS — Z1619 Resistance to other specified beta lactam antibiotics: Secondary | ICD-10-CM

## 2011-01-17 DIAGNOSIS — H109 Unspecified conjunctivitis: Secondary | ICD-10-CM | POA: Diagnosis present

## 2011-01-17 DIAGNOSIS — E042 Nontoxic multinodular goiter: Secondary | ICD-10-CM | POA: Diagnosis present

## 2011-01-17 DIAGNOSIS — T380X5A Adverse effect of glucocorticoids and synthetic analogues, initial encounter: Secondary | ICD-10-CM | POA: Diagnosis present

## 2011-01-17 DIAGNOSIS — Z859 Personal history of malignant neoplasm, unspecified: Secondary | ICD-10-CM

## 2011-01-17 DIAGNOSIS — B370 Candidal stomatitis: Secondary | ICD-10-CM | POA: Diagnosis not present

## 2011-01-17 DIAGNOSIS — I10 Essential (primary) hypertension: Secondary | ICD-10-CM | POA: Diagnosis present

## 2011-01-17 DIAGNOSIS — Z905 Acquired absence of kidney: Secondary | ICD-10-CM

## 2011-01-17 DIAGNOSIS — A498 Other bacterial infections of unspecified site: Secondary | ICD-10-CM | POA: Diagnosis present

## 2011-01-17 DIAGNOSIS — E785 Hyperlipidemia, unspecified: Secondary | ICD-10-CM | POA: Diagnosis present

## 2011-01-17 DIAGNOSIS — J4489 Other specified chronic obstructive pulmonary disease: Secondary | ICD-10-CM | POA: Diagnosis present

## 2011-01-17 DIAGNOSIS — B9689 Other specified bacterial agents as the cause of diseases classified elsewhere: Secondary | ICD-10-CM | POA: Diagnosis present

## 2011-01-17 DIAGNOSIS — IMO0002 Reserved for concepts with insufficient information to code with codable children: Secondary | ICD-10-CM

## 2011-01-17 DIAGNOSIS — Z85528 Personal history of other malignant neoplasm of kidney: Secondary | ICD-10-CM

## 2011-01-17 DIAGNOSIS — I2510 Atherosclerotic heart disease of native coronary artery without angina pectoris: Secondary | ICD-10-CM | POA: Diagnosis present

## 2011-01-17 HISTORY — DX: Essential (primary) hypertension: I10

## 2011-01-18 LAB — CBC
HCT: 41.7 % (ref 36.0–46.0)
Hemoglobin: 13.8 g/dL (ref 12.0–15.0)
MCH: 32.5 pg (ref 26.0–34.0)
MCHC: 33.1 g/dL (ref 30.0–36.0)
MCV: 98.3 fL (ref 78.0–100.0)
Platelets: 218 10*3/uL (ref 150–400)
RBC: 4.24 MIL/uL (ref 3.87–5.11)
RDW: 12.7 % (ref 11.5–15.5)
WBC: 14.5 10*3/uL — ABNORMAL HIGH (ref 4.0–10.5)

## 2011-01-18 LAB — COMPREHENSIVE METABOLIC PANEL
ALT: 33 U/L (ref 0–35)
AST: 28 U/L (ref 0–37)
Albumin: 3.4 g/dL — ABNORMAL LOW (ref 3.5–5.2)
Alkaline Phosphatase: 53 U/L (ref 39–117)
BUN: 14 mg/dL (ref 6–23)
CO2: 30 mEq/L (ref 19–32)
Calcium: 8.9 mg/dL (ref 8.4–10.5)
Chloride: 96 mEq/L (ref 96–112)
Creatinine, Ser: 0.84 mg/dL (ref 0.4–1.2)
GFR calc Af Amer: >60 mL/min (ref >60)
GFR calc non Af Amer: >60 mL/min (ref >60)
Glucose, Bld: 165 mg/dL — ABNORMAL HIGH (ref 70–99)
Potassium: 4 mEq/L (ref 3.5–5.1)
Sodium: 137 mEq/L (ref 135–145)
Total Bilirubin: 0.8 mg/dL (ref 0.3–1.2)
Total Protein: 6.5 g/dL (ref 6.0–8.3)

## 2011-01-18 LAB — DIFFERENTIAL
Basophils Absolute: 0 10*3/uL (ref 0.0–0.1)
Basophils Relative: 0 % (ref 0–1)
Eosinophils Absolute: 0.1 10*3/uL (ref 0.0–0.7)
Eosinophils Relative: 1 % (ref 0–5)
Lymphocytes Relative: 6 % — ABNORMAL LOW (ref 12–46)
Lymphs Abs: 0.8 10*3/uL (ref 0.7–4.0)
Monocytes Absolute: 0.8 10*3/uL (ref 0.1–1.0)
Monocytes Relative: 6 % (ref 3–12)
Neutro Abs: 12.8 10*3/uL — ABNORMAL HIGH (ref 1.7–7.7)
Neutrophils Relative %: 88 % — ABNORMAL HIGH (ref 43–77)

## 2011-01-19 ENCOUNTER — Inpatient Hospital Stay (HOSPITAL_COMMUNITY): Payer: BC Managed Care – PPO

## 2011-01-19 ENCOUNTER — Encounter (HOSPITAL_COMMUNITY): Payer: Self-pay | Admitting: Radiology

## 2011-01-19 MED ORDER — IOHEXOL 300 MG/ML  SOLN
80.0000 mL | Freq: Once | INTRAMUSCULAR | Status: AC | PRN
  Administered 2011-01-19: 80 mL via INTRAVENOUS

## 2011-01-21 LAB — CULTURE, RESPIRATORY W GRAM STAIN: Gram Stain: NONE SEEN

## 2011-01-21 LAB — CULTURE, RESPIRATORY

## 2011-01-22 ENCOUNTER — Encounter: Payer: Self-pay | Admitting: Internal Medicine

## 2011-01-22 ENCOUNTER — Inpatient Hospital Stay (HOSPITAL_COMMUNITY): Payer: BC Managed Care – PPO

## 2011-01-22 DIAGNOSIS — R05 Cough: Secondary | ICD-10-CM

## 2011-01-22 DIAGNOSIS — J449 Chronic obstructive pulmonary disease, unspecified: Secondary | ICD-10-CM

## 2011-01-22 DIAGNOSIS — N390 Urinary tract infection, site not specified: Secondary | ICD-10-CM

## 2011-01-22 DIAGNOSIS — R059 Cough, unspecified: Secondary | ICD-10-CM

## 2011-01-22 LAB — COMPREHENSIVE METABOLIC PANEL
ALT: 60 U/L — ABNORMAL HIGH (ref 0–35)
AST: 28 U/L (ref 0–37)
Albumin: 3.1 g/dL — ABNORMAL LOW (ref 3.5–5.2)
Alkaline Phosphatase: 48 U/L (ref 39–117)
BUN: 11 mg/dL (ref 6–23)
CO2: 29 mEq/L (ref 19–32)
Calcium: 8.9 mg/dL (ref 8.4–10.5)
Chloride: 108 mEq/L (ref 96–112)
Creatinine, Ser: 0.72 mg/dL (ref 0.4–1.2)
GFR calc Af Amer: >60 mL/min (ref >60)
GFR calc non Af Amer: >60 mL/min (ref >60)
Glucose, Bld: 80 mg/dL (ref 70–99)
Potassium: 4.1 mEq/L (ref 3.5–5.1)
Sodium: 142 mEq/L (ref 135–145)
Total Bilirubin: 0.4 mg/dL (ref 0.3–1.2)
Total Protein: 6.2 g/dL (ref 6.0–8.3)

## 2011-01-22 LAB — CBC
HCT: 39.3 % (ref 36.0–46.0)
Hemoglobin: 12.7 g/dL (ref 12.0–15.0)
MCH: 32.2 pg (ref 26.0–34.0)
MCHC: 32.3 g/dL (ref 30.0–36.0)
MCV: 99.5 fL (ref 78.0–100.0)
Platelets: 168 10*3/uL (ref 150–400)
RBC: 3.95 MIL/uL (ref 3.87–5.11)
RDW: 13.2 % (ref 11.5–15.5)
WBC: 8.4 10*3/uL (ref 4.0–10.5)

## 2011-01-22 LAB — HIV ANTIBODY (ROUTINE TESTING W REFLEX): HIV: NONREACTIVE

## 2011-01-22 LAB — DIFFERENTIAL
Basophils Absolute: 0 10*3/uL (ref 0.0–0.1)
Basophils Relative: 0 % (ref 0–1)
Eosinophils Absolute: 0.2 10*3/uL (ref 0.0–0.7)
Eosinophils Relative: 2 % (ref 0–5)
Lymphocytes Relative: 17 % (ref 12–46)
Lymphs Abs: 1.4 10*3/uL (ref 0.7–4.0)
Monocytes Absolute: 0.7 10*3/uL (ref 0.1–1.0)
Monocytes Relative: 8 % (ref 3–12)
Neutro Abs: 6.1 10*3/uL (ref 1.7–7.7)
Neutrophils Relative %: 73 % (ref 43–77)

## 2011-01-22 LAB — CLOSTRIDIUM DIFFICILE BY PCR: Toxigenic C. Difficile by PCR: NEGATIVE

## 2011-01-23 ENCOUNTER — Telehealth: Payer: Self-pay | Admitting: Pulmonary Disease

## 2011-01-23 DIAGNOSIS — J449 Chronic obstructive pulmonary disease, unspecified: Secondary | ICD-10-CM

## 2011-01-23 DIAGNOSIS — R05 Cough: Secondary | ICD-10-CM

## 2011-01-23 DIAGNOSIS — N390 Urinary tract infection, site not specified: Secondary | ICD-10-CM

## 2011-01-23 DIAGNOSIS — R059 Cough, unspecified: Secondary | ICD-10-CM

## 2011-01-23 NOTE — Telephone Encounter (Signed)
Called and spoke with pt's husband.  Pt scheduled for post hosp f/u appt with kc for Friday 02-02-2011 at 4pm.

## 2011-01-23 NOTE — Telephone Encounter (Signed)
Megan and KC, Please advise where you would like to add patient in on schedule for post hospital Thanks.

## 2011-01-26 ENCOUNTER — Ambulatory Visit: Payer: BC Managed Care – PPO | Admitting: Pulmonary Disease

## 2011-01-31 ENCOUNTER — Encounter: Payer: Self-pay | Admitting: *Deleted

## 2011-01-31 NOTE — Consult Note (Signed)
[name], KULIG                ACCOUNT NO.:  [id number]  MEDICAL RECORD NO.:  [id number]           PATIENT TYPE:  I  LOCATION:  1314                         FACILITY:  [facility]  PHYSICIAN:  Acey Lav, MD  DATE OF BIRTH:  1951-02-27  DATE OF CONSULTATION: DATE OF DISCHARGE:                                CONSULTATION   REASON FOR CONSULTATION:  The patient is with current urinary tract infections with extended spectrum betalactamase producing E Coli.  HISTORY OF PRESENT ILLNESS:  [name] is a 60 year old Caucasian female with a past medical history significant for problems with recurrent urinary tract infections also with history of nephrectomy and problems of chronic reflux of urine into her kidneys with scarring of the kidneys, followed by Dr. Malen Gauze in [place].  She was recently hospitalized at [facility] in February and treated during that time for a COPD exacerbation.  She had grown ESBL from her urine and had true symptoms concerning for urinary tract infection, was treated with imipenem and then changed to intravenous ertapenem.  Unfortunately, the ertapenem infiltrated her forearm during the week, but the patient did not have a PICC line while she was getting this.  She then was off the Invanz for a number of days and urine was clear, but then on January 17, 2011, came in complaining of tenderness over her bladder and change in color of her urine and she had a positive urinalysis with once again greater than 100,000 colony forming units of extended spectrum beta lactamase producing E Coli.  She was ultimately hospitalized that day to [facility] where she was placed on imipenem and this has resulted in great improvement in her symptoms.  She in the interim also developed worsening cough with a ring of apparently purulent secretions and was being treated for COPD exacerbation.  She had a CT of her chest performed, which showed some centrilobular  emphysema and some nodularity in her right upper lobe.  She had sputum cultures obtained from January 18, 2011, which grew an abundant amount of Stenotrophomonas maltophilia, which was sensitive to Bactrim and Levaquin, but resistant to ceftazidime.  The patient has been on imipenem and then most recently was started on Levaquin targeting at the Stenotrophomonas.  We were asked to see the patient to assist in the management and workup of her recurrent urinary tract infections and also to assess the significant Stenotrophomonas in the lungs and finally the patient has also recently had some increased matting of her right eyelids.  PAST MEDICAL HISTORY: 1. Reflux problems with urine. 2. History of right nephrectomy. 3. COPD with emphysema. 4. Coronary artery disease. 5. Renal cell carcinoma as described above. 6. Chronic headaches. 7. Allergic rhinitis.  PAST SURGICAL HISTORY: 1. History of tubal ligation. 2. History of left partial nephrectomy. 3. She also had reinsertion of ureter placed by Dr. Malen Gauze.  FAMILY HISTORY:  The parents had heart disease.  Father had ear, nose and throat cancer.  Grandmother had emphysema.  SOCIAL HISTORY:  The patient is married with children, is retired, lives with her husband.  She used to smoke a  pack per day, quit in 2000.  REVIEW OF SYSTEMS:  As described in the history of present illness, otherwise a 12-point review of systems is negative.  ALLERGIES:  She is allergic to THORAZINE, which causes anaphylaxis and IBUPROFEN, which caused the stomach to hurt.  CURRENT MEDICATIONS:  Albuterol, atenolol, D5-NS, Lovenox, fluticasone, guaifenesin, hydrochlorothiazide, imipenem, levofloxacin, losartan, multivitamins, ofloxacin, Protonix, MiraLax, prednisone, Valtrex, Spiriva, Ambien and Tussionex.  PHYSICAL EXAMINATION:  VITAL SIGNS:  Temperature maximum is 98.2, temperature current 98.2, blood pressure 148/75, pulse rate 80, respirations 20, pulse  oximetry 97% on 2 L of nasal cannula. GENERAL:  The patient is alert and oriented x4, quite pleasant. HEENT:  Normocephalic and atraumatic.  Pupils are equal, round and reactive to light.  Sclerae anicteric.   NECK:  Supple. CARDIOVASCULAR:  Regular rate rhythm.  No murmurs, gallops or rubs heard. LUNGS:  Some diffuse crackles at the bases in particular. ABDOMEN:  Soft, nondistended, nontender.  Positive bowel sounds. MUSCULOSKELETAL:  No CVA tenderness. EXTREMITIES:  With trace edema.  LABORATORY DATA:  Extended spectrum beta lactamase producing E Coli in urine as read on culture on January 17, 2011, per the chart.  Other laboratory data:  White blood cell count today is 8.4, hemoglobin 12.7, platelets 168, ANC of 6.1, comprehensive metabolic panel showed normal chemistries, alkaline phosphatase depressed at 3.1.  HIV antibody negative.  C diff PCR and stool is negative.  Further microbiological data:  Sputum culture from [facility] shows abundant Stenotrophomonas maltophilia, resistant to ceftazidime, sensitive to Bactrim and Levaquin.  IMPRESSION/RECOMMENDATIONS: 1. This is a 60 year old lady with a history of partial nephrectomy,     reinsertion of ureter with problems with recurrent urinary tract     infections, now with extended spectrum beta lactamase producing     Escherichia Coli causing symptomatic urinary tract infection, also     with Stenotrophomonas isolated from sputum culture. 2. Recurrent extended spectrum beta lactamase urinary tract     infections:  I think the patient to be continued on imipenem while     inpatient.  I would make sure that she has at least 7 days of     effective antibiotics with imipenem.  She is so far on day 6 by my     count of imipenem.  One could consider even extending this to a 2-     week course, but the patient does not want a PICC line and I think     she would need this to go home with intravenous antibiotics.  She     does not want  to use ertapenem as apparently she thinks that this     will cause infiltration that occurred, so I am happy to continue     the imipenem for now.  I talked to Dr. Maurice March and he wanted to try to     decolonize this patient and therefore, we have come up with the     following plan and that is to give the oral agent phosphomycin 3 g     every 3 days for a month and then stop this and see if this will     potentially allow her to recolonize her urine with another organism     other than the ESBL that she currently is growing.  One could also     contemplate imaging her belly with a CT scan.  She had required a     number of CT scans per her account.  I  will try and speak with her     urologist to see how often she has been evaluating this area.  The     patient herself tells me that the most recent CAT scan was 1 year     ago, but she has had ultrasounds done since then.  I do not feel     there is a rush at this point in time to a scan of her belly, but     this should be considered potentially if she has worsening symptoms     or recurrence again. 3. Stenotrophomonas from sputum culture.  On one hand, she has had     purulent sputum production by history and on the other hand, her CT     scan of the lungs is rather unimpressive and certainly she could     have today's sputum just from chronic obstructive pulmonary disease     exacerbation.  I do not feel like the Stenotrophomonas is a clear     pathogen here and would ignore it and stop the levofloxacin as the     latter antibiotic may drive the continued presence of her ESBL in     the urine. 4. Matting of the eye.  This seems more likely to be viral or allergic     in nature, but she is using some topical saline and also has some     other topical antibiotic eyedrops and this is not completely     unreasonable. 5. Thank you for this Infectious Disease consultation.  I will follow along closely.   ADDENDUM: The patient also had thrush  and we started her on a two week course of fluconazole for this  Acey Lav, MD     CV/MEDQ  D:  01/22/2011  T:  01/23/2011  Job:  [id number]  cc:   Barbaraann Share, MD,FCCP [address] [place] [address]  Electronically Signed by Paulette Blanch DAM MD on 01/31/2011 12:30:57 PM

## 2011-02-01 NOTE — Assessment & Plan Note (Signed)
Summary: Pulmonary Consultation/ UACS   Copy to:  b[facility] Primary Provider/Referring Provider:  [name] Baxely   History of Present Illness: Pt seen in consult see note.  Patient failed to answer a single question asked in a straightforward manner, tending to go off on tangents or answer questions with ambiguous medical terms or diagnoses and seemed upset when asked the same question more than once for clarification.   Imp  upper airway cough syndrome.   Classic Upper airway cough syndrome, so named because it's frequently impossible to sort out how much is  CR/sinusitis with freq throat clearing (which can be related to primary GERD)   vs  causing  secondary extra esophageal GERD from wide swings in gastric pressure that occur with throat clearing, promoting self use of mint and menthol lozenges that reduce the lower esophageal sphincter tone and exacerbate the problem further These are the same pts who not infrequently have failed to tolerate ace inhibitors,  dry powder inhalers or biphosphonates or report having reflux symptoms that don't respond to standard doses of PPI   Rec suppress the cough with tramadol/ stop advair/ max GERD rx and f/u Dr Shelle Iron    Allergies: No Known Drug Allergies

## 2011-02-02 ENCOUNTER — Ambulatory Visit (INDEPENDENT_AMBULATORY_CARE_PROVIDER_SITE_OTHER): Payer: BC Managed Care – PPO | Admitting: Pulmonary Disease

## 2011-02-02 ENCOUNTER — Encounter: Payer: Self-pay | Admitting: Pulmonary Disease

## 2011-02-02 VITALS — BP 100/68 | HR 90 | Temp 98.3°F | Ht 63.0 in | Wt 142.2 lb

## 2011-02-02 DIAGNOSIS — J449 Chronic obstructive pulmonary disease, unspecified: Secondary | ICD-10-CM

## 2011-02-02 NOTE — Patient Instructions (Signed)
Stay on dulera and spiriva, and keep mouth rinsed well. Stay on protonix for reflux Gradually increase your activity level, and will discuss sending you back to pulmonary rehab at next visit Stay on oxygen, and will test you next visit to see if we can discontinue. Will [facility] an [facility] to [facility] to show you a more portable oxygen source followup with me in 4 weeks.

## 2011-02-02 NOTE — Progress Notes (Signed)
  Subjective:    Patient ID: [name], female    DOB: [birth year], 60 y.o.   MRN: [id number]  HPI The pt is comes in today for posthospital f/u.  She has known moderate to severe copd, and had been doing fairly well in the past.  Most recently, she has had an acute exacerbation, followed by issues with severe cough that was felt to be more upper airway in origin.  She is clearly better, but has not returned to baseline. She tells me she is about 50% better.  She is still requiring oxygen, and states her sats fall to the mid 80's on RA at home.  She has minimal cough, but does produce scant green mucus.  She feels her "airway are clear".     Review of Systems  Constitutional: Positive for fever. Negative for appetite change and unexpected weight change.  HENT: Negative for ear pain, congestion, sore throat, rhinorrhea, sneezing, trouble swallowing, dental problem and postnasal drip.   Eyes: Negative for redness.  Respiratory: Positive for cough and shortness of breath. Negative for wheezing.   Cardiovascular: Negative for chest pain, palpitations and leg swelling.  Gastrointestinal: Negative for nausea, abdominal pain and diarrhea.  Genitourinary: Negative for dysuria.  Musculoskeletal: Negative for joint swelling.  Skin: Negative for rash.  Neurological: Negative for syncope and headaches.  Hematological: Does not bruise/bleed easily.  Psychiatric/Behavioral: Negative for dysphoric mood. The patient is not nervous/anxious.        Objective:   Physical Exam Wd female in nad  nares without discharge or purulence Chest with mildly decreased airflow, but no wheezing or rhonchi Cor with RRR LE without significant edema, no cyanosis Alert and oriented, moves all 4        Assessment & Plan:

## 2011-02-02 NOTE — Assessment & Plan Note (Signed)
The pt has a history of moderate to severe obstructive disease, and most recently has had an acute exacerbation.  She then had severe cough that was more upper airway driven, and improved with change in maintenance bronchodilator to dulera and treatment of reflux disease.  Her CT sinuses were unremarkable.  She comes in today where she is about 50% improved since most recent hospitalization.  She is now oxygen dependent, and has mild cough with small quantity of discolored mucus.  She is colonized with stenotrophomonas that is only sensitive to levaquin and bactrim.  She has not had increased quantity of mucus or worsening chest congestion so far.

## 2011-02-03 NOTE — Consult Note (Signed)
  NAMEKENLEA, WOODELL                ACCOUNT NO.:  [id number]  MEDICAL RECORD NO.:  [id number]           PATIENT TYPE:  I  LOCATION:  1314                         FACILITY:  [facility]  PHYSICIAN:  Casimiro Needle B. Sherene Sires, MD, FCCPDATE OF BIRTH:  December 19, 1950  DATE OF CONSULTATION:  01/22/2011 DATE OF DISCHARGE:                                CONSULTATION   REASON FOR CONSULTATION:  Dyspnea and cough.  HISTORY OF PRESENT ILLNESS:  This is a pleasant 60 year old female patient who is currently followed by Dr. Shelle Iron in the outpatient setting for chronic obstructive pulmonary disease, it appears as though her last visit with him was in 2008.  In the meantime, Ms. Howerter was recently hospitalized for initially what appeared to be an upper respiratory tract infection, which responded to the typical therapy for acute exacerbation of chronic obstructive pulmonary disease.  She improved, was discharged to home, then on February 28th was readmittedfor, again, repeat exacerbation.  Ultimately, it was felt that this was due to a viral respiratory tract infection, more importantly a beta- lactamase producing Escherichia coli urinary tract infection.  She was discharged to home on January 08, 2011, on supplemental oxygen, a slow prednisone taper, and IV antibiotics.  She was discharged to home on Invanz and reports that her IV infiltrated after approximately 3 days of therapy.   DICTATION ENDED AT THIS POINT/SEE FULL DICTATION     Zenia Resides, NP   ______________________________ Charlaine Dalton. Sherene Sires, MD, FCCP    PB/MEDQ  D:  01/22/2011  T:  01/23/2011  Job:  [id number]  Electronically Signed by Zenia Resides NP on 01/30/2011 09:39:04 AM Electronically Signed by Sandrea Hughs MD FCCP on 02/03/2011 09:51:39 AM
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
## 2011-02-14 ENCOUNTER — Ambulatory Visit (INDEPENDENT_AMBULATORY_CARE_PROVIDER_SITE_OTHER): Payer: BC Managed Care – PPO | Admitting: Infectious Disease

## 2011-02-14 DIAGNOSIS — N390 Urinary tract infection, site not specified: Secondary | ICD-10-CM | POA: Insufficient documentation

## 2011-02-14 DIAGNOSIS — B9689 Other specified bacterial agents as the cause of diseases classified elsewhere: Secondary | ICD-10-CM

## 2011-02-14 DIAGNOSIS — N1370 Vesicoureteral-reflux, unspecified: Secondary | ICD-10-CM

## 2011-02-14 DIAGNOSIS — C649 Malignant neoplasm of unspecified kidney, except renal pelvis: Secondary | ICD-10-CM

## 2011-02-14 DIAGNOSIS — Z1612 Extended spectrum beta lactamase (ESBL) resistance: Secondary | ICD-10-CM

## 2011-02-14 DIAGNOSIS — Z905 Acquired absence of kidney: Secondary | ICD-10-CM

## 2011-02-14 DIAGNOSIS — A499 Bacterial infection, unspecified: Secondary | ICD-10-CM

## 2011-02-14 DIAGNOSIS — Z1619 Resistance to other specified beta lactam antibiotics: Secondary | ICD-10-CM

## 2011-02-14 DIAGNOSIS — B370 Candidal stomatitis: Secondary | ICD-10-CM

## 2011-02-14 NOTE — Patient Instructions (Addendum)
Complete fosfomycin I would recommend Dr Malen Gauze resample urine prior to cystoscopy if she wants to give you antibiotics that are targetted at your urinary flora prior to her procedure

## 2011-02-14 NOTE — Assessment & Plan Note (Signed)
We will allow her to continue and complete her fosfomycin regimen. We'll then observe her off antibiotics and not culture her urine she is symptomatic. She is going to have a CT of her abdomen performed by Dr. Malen Gauze.

## 2011-02-14 NOTE — Assessment & Plan Note (Signed)
Resolved

## 2011-02-14 NOTE — Assessment & Plan Note (Signed)
See above plan with regard to recurrent urinary tract infections. We're attempting to remove this organism from her urine and allow different organisms, as the urine.

## 2011-02-14 NOTE — Assessment & Plan Note (Signed)
This  appears to be than a cause of her recurrent bladder infections.

## 2011-02-14 NOTE — Assessment & Plan Note (Signed)
Patient being followed by Dr. Malen Gauze for this.

## 2011-02-14 NOTE — Progress Notes (Signed)
Subjective:    Patient ID: [name], female    DOB: Jan 13, 1951, 60 y.o.   MRN: [id number]  HPI   60 year old  with a hx of nephrectomy renal cell carcinoma sp reimplantation of ureter and   problems of chronic reflux of urine into her kidneys with scarring of   the kidneys, followed by Dr. Malen Gauze in [place].  She was recently  hospitalized at [facility] in February and treated during that time for   a COPD exacerbation.  She had grown ESBL from her urine and had true  symptoms concerning for urinary tract infection, was treated with   imipenem and then changed to intravenous ertapenem.  Unfortunately, the ertapenem infiltrated her forearm during the week, but the patient didnot have a PICC line while she was getting this.  She then was off the  Invanz for a number of days and urine was clear, but then on January 17, 2011, came in complaining of tenderness over her bladder and change in color of her urine and she had a positive urinalysis with once again   greater than 100,000 colony forming units of extended spectrum beta lactamase producing E Coli.  She was ultimately hospitalized that day to   [facility] where she was placed on imipenem . We ultimately transition her to oral fosfomycin and she was continued on this as an outpatient. She is followed up with her urologist Dr. Malen Gauze in [place]. Dr. Malen Gauze called me last week. She plans on obtaining a CT scan of the patient. We discussed this at length. The patient's your urine was sampled last week by Dr. Malen Gauze and she grew an unusual organism. The patient herself was at that time completely asymptomatic. I do have the organism on hand here but it was susceptible to amoxicillin as well as fluoroquinolones. Again as I discussed with Dr. Malen Gauze I would not treat this. The patient herself continues to be asymptomatic and feels well she is without fevers or chills. She says that when she has urinary tract infections and she  feels a fullness in her bladder and developed fevers. There are plans for a cystoscopy upcoming. Dr. Malen Gauze apparently had planned to give the patient ciprofloxacin prior to this cystoscopy. I would prefer if the urine was resampled so we can see what bacteria the patient had her urine more and at that time for more close in time to her cystoscopy if and prophylactic antibiotics were desired that time. We reviewed with the patient's symptoms that would trigger he use of antibiotics and the need to be careful with the indiscriminate use of antibiotics. In the interim the plan is for her to complete her regimen of fosfomycin and then be observed off of antibiotics. Her thrush has cleared up with fluconazole. Her COPD is under control without cord the steroids being given systemically.Review of Systems As per history of present illness. Otherwise 12 point review of systems is negative    Objective:   Physical Exam    patient is alert and oriented x4. Her HEENT is normocephalic her pupils are equal round and reactive to light her sclerae anicteric her oropharynx is clear without exudate or thrush. Her neck is supple she has a nasal cannula in her nose. Her cardiovascular exam reveals a regular rate and rhythm no murmurs gallop or rubs heard lungs with prolonged expiratory phase but no wheezes rhonchi or rales abdomen soft nondistended nontender. No CVA tenderness either. Her extremities were with trace edema.  Her neurological exam was nonfocal. Skin no rashes.    Assessment & Plan:  Recurrent UTI We will allow her to continue and complete her fosfomycin regimen. We'll then observe her off antibiotics and not culture her urine she is symptomatic. She is going to have a CT of her abdomen performed by Dr. Malen Gauze.  ESBL (extended spectrum beta-lactamase) producing bacteria infection See above plan with regard to recurrent urinary tract infections. We're attempting to remove this organism from her urine and  allow different organisms, as the urine.  Urinary reflux This  appears to be than a cause of her recurrent bladder infections.  Thrush Resolved.  Renal cell carcinoma Patient being followed by Dr. Malen Gauze for this.
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
## 2011-02-28 ENCOUNTER — Encounter: Payer: Self-pay | Admitting: Pulmonary Disease

## 2011-03-02 ENCOUNTER — Ambulatory Visit (INDEPENDENT_AMBULATORY_CARE_PROVIDER_SITE_OTHER): Payer: BC Managed Care – PPO | Admitting: Pulmonary Disease

## 2011-03-02 ENCOUNTER — Encounter: Payer: Self-pay | Admitting: Pulmonary Disease

## 2011-03-02 VITALS — BP 120/66 | HR 86 | Temp 98.7°F | Ht 63.0 in | Wt 143.0 lb

## 2011-03-02 DIAGNOSIS — J449 Chronic obstructive pulmonary disease, unspecified: Secondary | ICD-10-CM

## 2011-03-02 NOTE — Progress Notes (Signed)
  Subjective:    Patient ID: [name], female    DOB: 10/16/1951, 60 y.o.   MRN: [id number]  HPI The pt comes in today for f/u of her known emphysema with chronic respiratory failure.  She is maintaining on her meds, and has seen improvement in her breathing and endurance.  Her hoarseness is much improved off advair.  She is still requiring oxygen, and gets tired very easily.  Denies cough or congestion.  No purulence.    Review of Systems  Constitutional: Negative for fever and unexpected weight change.  HENT: Positive for nosebleeds, rhinorrhea and sinus pressure. Negative for ear pain, congestion, sore throat, sneezing, trouble swallowing, dental problem and postnasal drip.   Eyes: Negative for redness and itching.  Respiratory: Negative for cough, chest tightness, shortness of breath and wheezing.   Cardiovascular: Positive for palpitations. Negative for leg swelling.  Gastrointestinal: Negative for nausea and vomiting.  Genitourinary: Negative for dysuria.  Musculoskeletal: Negative for joint swelling.  Skin: Negative for rash.  Neurological: Positive for headaches.  Hematological: Bruises/bleeds easily.  Psychiatric/Behavioral: Negative for dysphoric mood. The patient is not nervous/anxious.        Objective:   Physical Exam Wd female in nad Chest with decreased bs, no wheezing or rhonci Cor with rrr LE without edema, no cyanosis Alert and oriented, moves all 4        Assessment & Plan:

## 2011-03-02 NOTE — Patient Instructions (Signed)
Check on the "inogen" portable concentrator for your trip No change in meds, stay on dulera Will refer to pulmonary rehab for endurance followup with me just before your trip.

## 2011-03-02 NOTE — Assessment & Plan Note (Signed)
The pt is improving from her hospitalizations, but is not back to baseline.  She is still requiring oxygen, but has improved exercise tolerance and no cough or congestion.  Her hoarseness is much improved with treatment of reflux and changing her inhaler to dulera.  At this point, I have asked her to continue with current meds, and she agrees to participating in pulm rehab.  Will make the referral

## 2011-03-16 ENCOUNTER — Telehealth: Payer: Self-pay | Admitting: *Deleted

## 2011-03-16 DIAGNOSIS — R519 Headache, unspecified: Secondary | ICD-10-CM

## 2011-03-16 MED ORDER — BUTALBITAL-ASPIRIN-CAFFEINE 50-325-40 MG PO CAPS: 1.0000 | ORAL_CAPSULE | ORAL | Status: DC | PRN

## 2011-03-16 NOTE — Telephone Encounter (Signed)
Fiorinal RX reordered per MD.

## 2011-03-20 NOTE — Assessment & Plan Note (Signed)
[facility]                             PULMONARY OFFICE NOTE   [name], [name]                       MRN:          [id number]  DATE:05/15/2007                            DOB:          [birth year]    HISTORY OF PRESENT ILLNESS:  Patient is a 60 year old white female who I  have been asked to see for dyspnea.  The patient carries the diagnosis  of obstructive lung disease and has been noticing dyspnea on exertion.  Patient recently had a trip to [place] in May and noted significant  difficulties with stairs and also with her endurance over a long period  of time.  Patient states that she will get dyspneic with loads, stairs,  and also she has to make at least two trips to unload groceries from the  car.  She primarily has focused on moderate levels of exertion as being  problems for her.  She has no difficulties with her activities of daily  living.  Patient has no significant cough or mucus.  She has no history  of asthma as a child or early adulthood.  She does have a history of  smoking one pack per day for 35 years but has not done so since 2000.  She also states that she is a grandmother who had emphysema and never  smoked.  Patient has had recent pulmonary function studies done on May 08, 2007, where she was found to have an FEV1 of 1.61 and an FEV1% of 52.  This is consistent with moderate obstructive lung disease.  There was  definite air trapping, and the DLCO was moderately reduced.  In terms of  pulmonary medications, she is currently on Advair 250/50 b.i.d.  She has  been tried on Spiriva for short-term in the past and felt it did not  seem to change things; however, she was not on it for more than a few  weeks.   PAST MEDICAL HISTORY:  1. Hypertension.  2. History of coronary disease with a normal EF.  3. History of what sounds like renal cell carcinoma with partial left      nephrectomy.  4. History of chronic headaches.  5. History  of allergic rhinitis.  6. History of tubal ligation.   CURRENT MEDICATIONS:  1. Atenolol 25 mg daily.  2. Hydrochlorothiazide 12.5 mg daily.  3. Trimethoprim 160 daily.  4. Nasacort AQ 1 spray daily.  5. Advair 250/50 1 b.i.d.  6. Zyrtec 10 mg nightly.  7. Vytorin 10/40 nightly.  8. Glycolax 1/2 to 1 daily.  9. A few other p.r.n. medications that she rarely takes.   The patient has no known drug allergies.   SOCIAL HISTORY:  She is married and has children.  She is retired and  lives with her husband.  She has a history of smoking one pack per day  for 35 years.  She has not smoked since 2000.   FAMILY HISTORY:  Remarkable for her mother having emphysema.  Both of  her parents had heart disease.  Her father has a history of ENT  cancer.   REVIEW OF SYSTEMS:  As per history of present illness.  Also see patient  intake form documented on the chart.   PHYSICAL EXAMINATION:  GENERAL:  She is a well-developed female in no  acute distress.  VITAL SIGNS:  Blood pressure 126/84, pulse 60, temperature 98, weight  243 pounds.  O2 saturation on room air is 96%.  HEENT:  Pupils are equal, round and reactive to light and accommodation.  Extraocular muscles are intact.  Nares are patent without discharge.  Oropharynx is clear.  NECK:  Supple without JVD or lymphadenopathy.  There is no palpable  thyromegaly.  CHEST:  Decreased breath sounds but adequate flow with no wheezes or  rhonchi.  CARDIAC:  Regular rate and rhythm.  ABDOMEN:  Soft and nontender with good bowel sounds.  GENITAL/BREAST/RECTAL:  Not done, not indicated.  LOWER EXTREMITIES:  Without edema.  Good pulses distally.  No calf  tenderness.  NEUROLOGIC:  Alert and oriented with no obvious motor deficits.   IMPRESSION:  Moderate-to-severe obstructive lung disease noted by recent  pulmonary function studies, with definite difficulty with moderate  exertion at home.  With the decreased DLCO, I suspect the patient truly   does have emphysema.  With her air trapping noted on lung volumes, would  probably benefit from the addition of Spiriva.  Typically, patients will  need approximately two months to notice a difference in their exercise  tolerance, as their lungs become unstacked.  I also think the patient  would benefit from pulmonary rehab, and she is agreeable to looking into  this.   PLAN:  1. Add Spiriva to the Advair, 1 puff daily, and continue albuterol      p.r.n. for rescue.  2. Refer to pulmonary rehab.  3. Will check chest x-ray today as well as an alpha-1 antitrypsin      level.  4. I have noticed the patient is on atenolol, a nonselective beta      blocker.  Most patient with obstructive lung disease should      probably be on a more selective beta blocker such as metoprolol.  I      will leave it to Dr. [name] to make the change if he feels that it      is appropriate.  5. Patient will follow up in approximately eight weeks or sooner if      there are problems.     Barbaraann Share, MD,FCCP  Electronically Signed   [facility]/Med[facility]  DD: 05/19/2007  DT: 05/19/2007  Job #: [id number]   cc:   [name]. [name], MD, [facility]  [name] [name], M.D.

## 2011-03-20 NOTE — Cardiovascular Report (Signed)
[name], [name]                ACCOUNT NO.:  [id number]   MEDICAL RECORD NO.:  [id number]          PATIENT TYPE:  OIB   LOCATION:  1962                         FACILITY:  [facility]   PHYSICIAN:  [name]. [name], MD, FACCDATE OF BIRTH:  30-Jun-1951   DATE OF PROCEDURE:  07/22/2007  DATE OF DISCHARGE:  07/22/2007                            CARDIAC CATHETERIZATION   INDICATIONS:  The patient is a very delightful 60 year old woman who has  a long history of smoking.  She quit several years ago.  She does have  COPD by chest x-ray.  She has also had shortness of breath.  She  recently has had a couple of episodes of chest discomfort.  She had  previous cardiac catheterization demonstrating noncritical disease.  We  talked about the various options.  This is her second episode in as many  months, so we elected to go ahead and recommend repeat diagnostic  catheterization.  This was discussed with the patient. and she consented  to proceed.   PROCEDURE:  1. Left heart catheterization.  2. Selective coronary arteriography.  3. Selective left ventriculography.   DESCRIPTION OF PROCEDURE:  The patient was brought to the  catheterization laboratory, prepped and draped in the usual fashion.  Through an anterior puncture, the right femoral artery was easily  entered.  A 4-French sheath was placed.  Views of  left and right  coronaries were obtained in multiple angiographic projections.  Central  aortic and left ventricular pressures measured with a pigtail.  Ventriculography was then done in the RAO projection.  Following  pressure pullback, the pigtail was removed.  The 4-French sheath was  removed, and hemostasis was achieved by direct manual compression in the  laboratory itself.   HEMODYNAMIC DATA.:  1. Central aortic pressure 150/78.  2. Left ventricular pressure 140/10.  3. There was no gradient on pullback across the aortic valve.   ANGIOGRAPHIC DATA.:  1. Ventriculography in the  RAO projection reveals vigorous global      systolic function.  Ejection fraction estimated in the range of      60%.  2. The right coronary is a small nondominant vessel.  It does not      appear to have significant focal obstruction.  3. The left main coronary artery is free of critical disease.  It      divides into a large left anterior descending artery and circumflex      vessel.  4. The left anterior descending artery has some mild proximal luminal      irregularity.  This is perhaps slightly less prominent than on the      previous study.  There is a small first diagonal branch that has      perhaps some mild ostial narrowing, but again compared to the      previous study seems to be less prominent.  There is a mid area of      stenosis in the LAD.  This measures about 30-40% luminal reduction.      It does not appear to be critical.  5. The circumflex is  a dominant vessel.  It tapers proximally into the      first marginal branch.  The ostium of the first marginal branch has      about 40% narrowing.  It does not appear to be flow limiting, but      clearly demonstrates eccentric plaquing proximally.  This distal      vessel is consistent with a bifurcating marginal and is free of      critical disease. Distally, the circumflex then provides a      posterolateral branch. The posterolateral branch does not have      significant focal narrowing.  The mid portion of the posterior      descending branch probably has about 40-50% narrowing as well, and      this is best seen in the spider view but again does not appear to      be critical.   CONCLUSION:  1. Preserved left ventricular function.  2. Mild plaquing in the mid left anterior descending.  3. Mild to moderate stenosis of the proximal marginal branch and mild      plaquing of the posterior descending branch.   DISPOSITION:  1. Continued medical therapy with lipid reduction.  2. We will discontinue her Imdur.  3. A  D-dimer will be obtained.  4. Outpatient echocardiography.  5. Continued pulmonary followup.      [name]. [name], MD, [facility]  Electronically Signed     TDS/MEDQ  D:  07/22/2007  T:  07/22/2007  Job:  [id number]

## 2011-03-20 NOTE — Letter (Signed)
May 05, 2007    [name] [name], M.D.  [address], [name]  [place], [place]  [address]   RE:  CORRINNE, BENEGAS  MRN:  [id number]  /  DOB:  [birth year]   Dear [name]:   I had the pleasure of seeing your nice patient, [name], in the  office today.  She was previously seen by  Dr. Dietrich Pates, but requested to be seen in this office.  This very nice  lady underwent cardiac catheterization in August 2006.  At that time she  had defined coronary heart disease.  The LAD had about a 30% lesion.  There was about 40-50% mid narrowing.  It was best seen after  vasodilatation.  The diagonal branch had a segmental ostial stenosis of  80%.  The second diagonal was small in caliber.  The circumflex  demonstrated about 30-40% area of smooth narrowing.  There was a  posterolateral branch and a posterior descending that had about 50% mid  narrowing, but again did not appear to be critical.  The right was a  nondominant vessel.  Ventriculography was performed in the RAO  projection without difficulty.  She was recently in [place] with her son-  in-law and daughter.  Her son-in-law is a Paramedic at  [employer] in [place].  The patient was having trouble getting around and  was somewhat short of breath.  As you know she has had asthma, but she  thought that this was perhaps slightly different from her asthma and she  noted fairly marked exercise intolerance.  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   MEDICATIONS:  1. Hydrochlorothiazide 12.5 mg daily.  2. Trimethoprim 100 mg daily.  3. Nasacort.  4. Advair 250/50 b.i.d.  5. Valtrex 500 daily.  6. Atenolol 25 mg daily.  7. Vytorin 10/40 daily.  8. Folic acid.  9. Super B complex.  10.Multivitamin.  11.Coenzyme QT.  12.Zinc.   PHYSICAL  EXAMINATION:  VITAL SIGNS:  Blood pressure 112/76, pulse 77.  HEART:  Notably she had a paradoxical splitting of the second heart  sound.  LUNGS:  She did have what sounded like prolonged expiration although not  frank wheezing.  EXTREMITIES:  Did not reveal significant edema.   A baseline electrocardiogram demonstrates normal sinus rhythm.  There is  a left bundle branch morphology as noted on previous tracings.   It was a pleasure to see this nice lady and discuss her situation.  She  has some concern and given her history did want some reassurance that  she did not have progressive coronary disease.  She also had quite a few  questions about Vytorin and whether or not it was an effective drug.  We  reviewed the enhanced trial in great detail and its potential meanings.  I explained the strategy of the Improved Trial which is a much more  representative trial in a patient with underlying coronary artery  disease and explained to her that the results of this were not yet  apparent.  I did try to reassure that Vytorin in fact does contain a  statin.  Next we talked about whether or not to reevaluate her coronary  situation.  She is quite concerned perhaps in some part about discussing  with her son-in-law  the potential that she have progression of disease.  Her asthma has not been definitely worse, but I might note that her exam  was really fairly striking for prolonged expiration.  I do think it  would be worthwhile to obtain function studies again and have her  reevaluated by the pulmonary disease service.  She has previously seen  one of my colleagues.  With her asthma she would not be a good candidate  for a radionuclide imaging study.   Finally we discussed the possibility of repeat cardiac catheterization.  We agreed that we would see her back in followup in about a week, and I  would have her think about this.  I also thought it would be worthwhile  for her to see the pulmonary  service before we made a final decision  regarding repeat catheterization.  She is not having typical ischemic  symptoms, and her disease burden was not great.  She is also on lipid  lowering therapy with the hope that she will have a stable situation.  I  will let you know when I see her back and send you a note at that time.  I appreciate the opportunity of sharing in her care.    Sincerely,      [name]. [name], MD, [facility]  Electronically Signed    TDS/MedQ  DD: 05/05/2007  DT: 05/06/2007  Job #: [id number]

## 2011-03-20 NOTE — Letter (Signed)
June 25, 2007    [name] [name], M.D.  [address], [name]  [place], [place] [address]   RE:  [name], [name]  MRN:  [id number]  /  DOB:  07-Nov-1950   Dear [name]:   I had the pleasure of seeing [name] in the office today at a  followup visit.  In general, she is substantially improved.  She seems  to be doing a lot better.  She has been traveling extensively with her  cat, and showing her cat.  She has recently been started on Spiriva by  Dr. Marcelyn Bruins, and she has had no more symptoms whatsoever.  [name]  felt that she probably had moderate to severe obstructive lung disease  by pulmonary function testing.  And, he also thought that pulmonary  rehab would be helpful.  She is on atenolol, although atenolol actually  is a selective beta-blocker.  She virtually has no wheezing at the  present time.  Her DLCO was 58% of predicted.  Her chest x-ray revealed  COPD with peribronchial thickening.  She denies any current symptoms.   PHYSICAL EXAMINATION:  GENERAL:  Today, she is alert and oriented in no  acute distress.  VITAL SIGNS:  The blood pressure is 126/82, pulse is 71.  LUNGS:  Fields are clear.  CARDIAC:  Rhythm is regular.  I did not appreciate a significant murmur.  There was no significantly prolonged expiration today.  EXTREMITIES:  There was no extremity edema.   I am quite pleased with how she is doing.  Her dyspnea has markedly  improved.  Given the data on hypertension, when you see her back it  would be reasonable to consider changing the atenolol to metoprolol.  They are both reasonably selective and I doubt that this has had much  impact on her obstructive airways disease.   Finally, she does have hypercholesterolemia and we have had some  discussions about her Vytorin.  We have elected to stop her Vytorin and  she thinks that she may have some symptoms and side effects from this.  We have, therefore, substituted Crestor.  We will add Crestor 20 mg  daily and I have instructed her on some side effects.  I also will have  her get a lipid and liver profile in 6 weeks and we will see her back in  followup in 3 months.   Thanks for allowing me to share in her care.    Sincerely,      [name]. [name], MD, [facility]  Electronically Signed    TDS/MedQ  DD: 06/25/2007  DT: 06/26/2007  Job #: [id number]   CC:    Barbaraann Share, MD,FCCP

## 2011-03-20 NOTE — Letter (Signed)
December 18, 2007    [name] [name], M.D.  [address], [name]  [place], [place] [address]   RE:  [name], [name]  MRN:  [id number]  /  DOB:  1951-05-14   Dear [name],   I had the pleasure of seeing [name] in the office today in follow-  up.  Since I last saw her, she has had four more kittens.  She has also  as you know been followed regularly with follow-up CT scans because of  pulmonary nodules.  These CT scans incidentally also show evidence of of  significant emphysema.  These likely account for much of her  symptomatology. Since I last saw her, she has gotten along reasonably  well. As you know, she is on a fairly complex medical regimen at this  point in time and I you are following her.  She has also had laboratory  studies and I think now is on Crestor.   Today on examination the blood pressure is 136/84, the pulse is 72. She  does not have expiratory wheezes, her cardiac rhythm is regular with  paradoxical splitting of the second heart sound.   Her electrocardiogram again demonstrates normal sinus rhythm with left  bundle branch block.   To summarize, I think she is doing reasonably well from a cardiac  standpoint. She does have scattered nonobstructive disease.  Medical  therapy is recommended with a goal of an LDL that is well controlled.  We will see her back in follow-up in 6 months.  Thanks for allowing [place]  to share in her care.    Sincerely,      [name]. [name], MD, [facility]  Electronically Signed    TDS/MedQ  DD: 12/18/2007  DT: 12/20/2007  Job #: [id number]

## 2011-03-20 NOTE — Letter (Signed)
December 18, 2007    [name]. [name], M.D.  [address] [name]  [place], [place] [address]   RE:  [name], [name]  MRN:  [id number]  /  DOB:  March 02, 1951   Dear [name],   I had the pleasure of seeing your nice patient, [name], in the  office today in followup.  Since I last saw her, her cat has given birth  to four more kittens.  She has been symptomatically improving.   DICTATION ENDED AT THIS POINT.    Sincerely,      [name]. [name], MD, [facility]    TDS/MedQ  DD: 12/18/2007  DT: 12/19/2007  Job #: [id number]

## 2011-03-20 NOTE — Assessment & Plan Note (Signed)
[facility]                            CARDIOLOGY OFFICE NOTE   [name], [name]                       MRN:          [id number]  DATE:07/18/2007                            DOB:          May 24, 1951    [name] is in for a followup visit. She called yesterday, she had had  2 episodes of chest pressure. The episodes really came while she was not  doing anything in particular. As a result, she called in and is seen  today as an add-on. She had recently been in [place] with her son-in-law  and was having trouble getting around and was short of breath. She has  had a history of asthma but she thought this was perhaps slightly  different. She was subsequently seen by Marcelyn Bruins, and was started on  Spiriva with fairly significant improvement in overall symptomatology  when I last her a few weeks ago. She has now had recurrent episodes of  chest discomfort.   Today on examination the blood pressure is 140/84, the pulse is 76.  LUNG FIELDS: Clear.  CARDIAC: Rhythm is regular.  There is not significant prolonged expiration.   The patient's electrocardiogram demonstrates normal sinus rhythm and is  consistent with left bundle branch block.   The patient's last chest x-ray was in July and at that time revealed no  acute cardiopulmonary disease and chronic obstructive pulmonary disease  with peribronchial thickening.   I have discussed the options with her. They had talked about the  possibility of doing a cardiac catheterization when I saw her back  previously, but with recurrent episodes of symptomatology she now is  quite concerned. I think it would make sense to go ahead and proceed  with a repeat cardiac catheterization. At the last catheterization  procedure, the patient had fairly significant disease. Her  catheterization previously was compatible with left bundle branch block,  and during the procedure she had evidence of a 30% LAD lesion with a  40%  to 50% mid LAD stenosis. The first diagonal had an ostial 80% stenosis,  the circumflex was dominant, and the first marginal had about 30% to 40%  narrowing and the PDA had a 50% area of stenosis. There was minor  irregularity of a non dominant right vessel.   [name] has had recurrent symptoms. She has known coronary artery  disease and left bundle branch block by EKG. She has had some recurrent  episodes of chest pain now after episodes that occurred in [place]. Based  on all of this I think we will go ahead with a repeat cardiac  catheterization, risks, benefits, and alternatives have been explained  to the patient and she is agreeable to proceed.     [name]. [name], MD, [facility]  Electronically Signed    TDS/MedQ  DD: 07/22/2007  DT: 07/22/2007  Job #: [id number]

## 2011-03-20 NOTE — H&P (Signed)
[name], [name]                ACCOUNT NO.:  [id number]   MEDICAL RECORD NO.:  [id number]          PATIENT TYPE:  OIB   LOCATION:  1961                         FACILITY:  [facility]   PHYSICIAN:  [name]. [name], MD, FACCDATE OF BIRTH:  [birth year]   DATE OF ADMISSION:  07/22/2007  DATE OF DISCHARGE:                              HISTORY & PHYSICAL   CHIEF COMPLAINT:  Chest tightness.   HISTORY OF PRESENT ILLNESS:  Ms. Wardle is a very pleasant 60 year old  female who was brought in for repeat cardiac catheterization.  She was  previously seen by Dr. Tenny Craw in our office.  The patient underwent  cardiac catheterization in 2006 and at that time had scattered  noncritical disease.  This summer, while she was in [place], the patient  had some episodes where she could barely get her breath with exertion.  Importantly, this continued.  Her previous chest x-rays have suggested  significant chronic obstructive pulmonary disease and she was  subsequently seen by the pulmonary group and placed on Spiriva with some  fairly impressive improvement in symptoms.  She also had DLCO of  approximately 58%.  Importantly, the patient has an underlying left  bundle branch block which has made evaluation difficult.  At the time,  we got her seen by the pulmonary medicine service and her symptoms  subsequently improved.  Of note, the patient does show her cat and  travels rather extensively.  More recently, the patient has developed  recurrent symptoms last week of chest discomfort.  There were two  episodes which were unexplained and she is reasonably anxious about all  of this.  Based on this, she had been quite anxious before and we have  elected to go ahead and recommend a cardiac catheterization to further  evaluate this in detail.  The patient's prior cardiac catheterization is  well documented.   MEDICATIONS CURRENTLY:  1. Hydrochlorothiazide 12.5 mg daily.  2. Trimethoprim 100 mg daily.  3.  Nasacort.  4. Advair 250/50 b.i.d.  5. Valtrex 500 daily.  6. Atenolol 25 daily.  7. Vytorin 10/40 daily.  8. Folic acid daily.  9. Super B complex.  10.Multivitamin.  11.Coenzyme Q10.  12.Zinc.  13.In addition, she has been taking Spiriva.   The patient's past medical history is remarkable for a prior partial  nephrectomy and ureteral implant.   The patient is married and does not smoke.   Review of systems is that we recently switched her Vytorin for Crestor.   PHYSICAL EXAMINATION:  She is an alert, oriented female in no acute  distress.  The blood pressure is 161/86, the O2 saturation is 94%.  The  height is 5 feet 3 inches, the weight 142 pounds, the heart rate is 74.  LUNGS:  Reveal slight prolonged expiration.  HEART SOUNDS:  Regular with a paradoxical splitting of the second heart  sound.  There is not a definite murmur.  ABDOMEN:  Soft.  Femoral pulses are intact.   EKG reveals underlying left bundle-branch block.   Recent chest x-ray is compatible with chronic obstructive pulmonary  disease.  IMPRESSION:  1. Chronic obstructive pulmonary disease with history of asthma.  2. Coronary disease as previously described by prior cardiac      catheterization study.  3. Hypercholesterolemia on lipid-lowering therapy.  4. Recent episodes of recurrent chest discomfort as well as shortness      of breath.  5. Longstanding left bundle-branch block.  6. History of prior partial nephrectomy.   PLAN:  Diagnostic cardiac catheterization is indicated.  The risks,  benefits and alternatives have been discussed in detail and she is  agreeable to proceed.      [name]. [name], MD, [facility]  Electronically Signed     TDS/MEDQ  D:  07/22/2007  T:  07/22/2007  Job:  [id number]

## 2011-03-20 NOTE — Assessment & Plan Note (Signed)
[facility]                            CARDIOLOGY OFFICE NOTE   Krista, Hicks                       MRN:          [id number]  DATE:09/02/2008                            DOB:          [birth year]    [name] is really getting along quite well.  She denies any ongoing chest  pain at the present time.  She overall feels well.  She has a little bit  of shortness of breath from time to time.  She was recently on a trip in  [place], and is planning to go to [place] soon.  She wants to get some  masks.  She denies any chest pain.   MEDICATIONS:  1. Atenolol 25 mg daily.  2. Hydrochlorothiazide 12.5 daily.  3. Excedrin 1 a.m.  4. Trimethoprim 160 mg daily.  5. Nasacort daily.  6. Advair 250/50 one p.o. b.i.d.  7. Zyrtec 10 mg.  8. Valtrex 500 mg 1 nightly.  9. GlycoLax 0.5 to 1 tablet daily.  10.Vagifem suppositories.  11.Spiriva 18 mcg daily.  12.Crestor 20 mg nightly.   PHYSICAL EXAMINATION:  GENERAL:  She is alert and oriented in no  distress.  VITAL SIGNS:  The weights is 150 pounds, blood pressure 122/90, and  pulse is 69.  LUNGS:  Fields are clear.  CARDIAC:  Regular.   The electrocardiogram demonstrates normal sinus rhythm.  There is an  underlying left bundle-branch block.   IMPRESSION:  1. Moderate coronary artery disease as demonstrated by catheterization      on July 22, 2007, with 30-40% narrowing in the left anterior      descending artery, 40-50% posterolateral system, left ventricular      function preserved.  2. History of asthma.   PLAN:  1. Return to clinic in 6 months.  2. She is going to wear a mask when she gets to [place], and we have      discussed issues with      regard to her travel.  3. Continue current medical regimen with followup with her primary      care physician.     [name]. [name], MD, [facility]  Electronically Signed    TDS/MedQ  DD: 09/02/2008  DT: 09/03/2008  Job #: [id number]   cc:   [name]  [name], M.D.

## 2011-03-21 ENCOUNTER — Telehealth: Payer: Self-pay | Admitting: Pulmonary Disease

## 2011-03-21 NOTE — Telephone Encounter (Signed)
Please advise Aundra Millet if you have received order for pt portable o2. Thanks  Carver Fila, CMA

## 2011-03-21 NOTE — Telephone Encounter (Signed)
Please advise Dr. Shelle Iron order is in your important look at folder. Thanks  Carver Fila, CMA

## 2011-03-21 NOTE — Telephone Encounter (Signed)
Received fax and put in [facility] very important look at folder for him to review.

## 2011-03-22 NOTE — Telephone Encounter (Signed)
done

## 2011-03-23 ENCOUNTER — Other Ambulatory Visit: Payer: BC Managed Care – PPO | Admitting: Internal Medicine

## 2011-03-23 DIAGNOSIS — Z0189 Encounter for other specified special examinations: Secondary | ICD-10-CM

## 2011-03-23 LAB — BUN: BUN: 16 mg/dL (ref 6–23)

## 2011-03-23 LAB — CREATININE, SERUM: Creat: 0.79 mg/dL (ref 0.40–1.20)

## 2011-03-23 NOTE — Cardiovascular Report (Signed)
[name], [name]                ACCOUNT NO.:  [id number]   MEDICAL RECORD NO.:  [id number]          PATIENT TYPE:  OIB   LOCATION:  6501                         FACILITY:  [facility]   PHYSICIAN:  [name]. [name], M.D. [facility] OF BIRTH:  1951/07/20   DATE OF PROCEDURE:  06/12/2005  DATE OF DISCHARGE:  06/12/2005                              CARDIAC CATHETERIZATION   INDICATIONS:  [name] is a 60 year old woman who has asthma. She has had  recent onset of some exertional angina. She had a normal Cardiolite from an  anatomic standpoint with mainly breast attenuation and an ejection fraction  of 65%. Her echocardiogram was not abnormal except for a septal wall motion  abnormality related to her underlying left bundle branch block. The current  study was done to assess coronary anatomy. Risks, benefits, and alternatives  were discussed with the patient in detail prior to proceeding.   PROCEDURES:  1.  Left heart catheterization.  2.  Selective coronary arteriography.  3.  Selective left ventriculography.   DESCRIPTION OF PROCEDURE:  The patient was brought to the catheterization  laboratory, prepped and draped in the usual fashion. Through an anterior  puncture, the right femoral artery was entered and a 4-French sheath was  placed. Views of the left and right coronary arteries were obtained in  multiple angiographic projections. The patient had a left dominant system.  Importantly, intracoronary nitroglycerin was administered which brought out  a mid-LAD area of narrowing. The procedure was uncomplicated. All catheters  were subsequently removed and hemostasis achieved by direct manual  compression. She tolerated procedure well.   HEMODYNAMIC DATA:  1.  Central aortic pressure 161/90, mean 120.  2.  Left ventricular pressure 148/10.  3.  No gradient on pullback across aortic valve.   ANGIOGRAPHIC DATA:  1.  Left main coronary artery was free of critical disease.  2.  The left  anterior descending artery has mild luminal irregularity in the      proximal vessel. This does not exceed 30% luminal reduction. The      midartery has an area of about 40-50% narrowing that is best seen after      vasodilatation with intracoronary nitroglycerin administration. It does      not appear to be critical. The distal LAD appears to wrap the apex.      There is a very first diagonal branch which is moderate size. The      diagonal branch has segmental ostial stenosis of about 80%. The second      diagonal was small in caliber and as was the third. There is a large      septal perforator. The LAD is as noted without critical narrowing other      than the one area noted after the administration of nitroglycerin.  3.  The circumflex is a dominant vessel. There was a major first marginal      branch. This first marginal branch on the initial shots demonstrated      about 30-40% area of smooth narrowing. It did not at all appeared to be  compromised and the minimum lumen diameter would appear to be in excess      of 2 mm. The vessel then courses posteriorly. The posterior vessel      demonstrates a bifurcating posterolateral branch and then a PDA. The PDA      probably has about 50% mid-narrowing but again does not appear to be      critical.  4.  The right coronary artery appears to be a nondominant vessel that is      free of critical disease. It was nondominant. There was minor luminal      irregularity in the midportion of the vessel that measures no more than      about 20%.   Ventriculography in the RAO projection reveals vigorous global systolic  function without segmental wall motion abnormality.   CONCLUSION:  1.  Well-preserved left ventricular function.  2.  Left dominant system with scattered abnormalities as noted above with      the predominant finding being a first diagonal branch with 80% ostial      narrowing in a small to moderate size vessel.    DISPOSITION:  Based on the above-findings, medical therapy will be  recommended. One will need to be careful about the use of beta blockade  given her history of asthma. We will make a follow-up visit with Dr. Tenny Craw  for final disposition.       TDS/MEDQ  D:  06/12/2005  T:  06/13/2005  Job:  [id number]   cc:   [name], M.D.   CV Laboratory   Patient's medical record   [name]. [name], M.D.  [address] [name]  [place]  [place] [address]  Fax: [phone]

## 2011-04-05 NOTE — Discharge Summary (Signed)
NAMENIOMI, VALENT                ACCOUNT NO.:  [id number]  MEDICAL RECORD NO.:  [id number]           PATIENT TYPE:  I  LOCATION:  1314                         FACILITY:  [facility]  PHYSICIAN:  [name] [name], M.D.   DATE OF BIRTH:  1951/09/26  DATE OF ADMISSION:  01/17/2011 DATE OF DISCHARGE:  01/26/2011                              DISCHARGE SUMMARY   BRIEF HISTORY:  This 60 year old white female was readmitted to [facility] after a trial of IV antibiotic therapy in the home setting.  Initially was admitted in late February through early March with an exacerbation of COPD with significant shortness of breath and respiratory distress in conjunction with an ESBL urinary tract infection.  At that time, she was discharged home on IV Invanz through a peripheral IV, but was not able to keep a peripheral IV and was seen in the office on January 15, 2011.  Antibiotics were discontinued. Urinalysis was checked, which was negative, but 2 days later the patient began to complain of cloudy urine and bladder tenderness.  She was readmitted to the hospital and was also found to have a cough that was productive of brown sputum.  In addition to having moderately severe COPD, she has a history of recurrent urinary tract infections with history of nephrectomy for renal cell carcinoma.  She has history of chronic reflux of urine into her kidneys with scarring.  She is followed by Dr. Coralyn Pear, urologist in [facility].  The patient was subsequently readmitted to the hospital on March 14 and was placed on IV imipenem.  Her symptoms gradually improved.  However, her cough returned with purulent secretions.  CT of the chest showed centrilobular emphysema, some nodularity in the right upper lobe.  Sputum cultures grew an abundant amount of Stenotrophomonas maltophilia, which was sensitive to Bactrim and Levaquin, but resistant to ceftazidime.  The patient was then started on Levaquin in  addition to the imipenem.  The patient was seen by infectious disease consultant and pulmonary consultant for assessment of these problems.  It is notable she was diagnosed with COPD in 2006 by the [facility].  She has had history of recurrent episodes of pyelonephritis and had reimplantation of her ureter in addition to removal of renal cell carcinoma by Dr. Malen Gauze in [facility].  She has a history of coronary artery disease and had a cardiac catheterization in 2008 revealing a 30-40% mid LAD lesion at the ostium of the first marginal and a 40% narrowing in mid posterior descending artery lesion of 40-50%.  History of hyperlipidemia.  Has been diagnosed with grade 4 ureteral reflux of the left kidney with a complicated 3.4 cm renal cyst and when she underwent her partial nephrectomy, it was found that she had renal cell carcinoma at the time the left ureter was reimplanted in January 2007.  She has had many urinary tract infections since that time.  At last admission, she was sent home on home oxygen 2 L per minute nasal prong.  She did go to pulmonary rehab in 2008.  She has been on Macrodantin for suppression therapy  of urinary tract infections, but that was discontinued the last admission.  History of herpes simplex for which she takes Valtrex 500 mg daily for prophylaxis.  She has been prescribed Spiriva HandiHaler, Nasacort nasal spray, Advair.  She takes Mucinex DM p.r.n.  HOSPITAL COURSE:  The patient was readmitted for administration of IV Primaxin 500 mg q.8 h.  She was continued on oxygen at 2 L nasal prong. Pulse oximetry was measured every shift.  She was seen in consultation by [facility] in addition to Dr. Daiva Eves from infectious disease service.  Dr. Daiva Eves spoke with Dr. Darlina Sicilian who had seen the patient during her last admission and decided that there would be an attempt made to try to decontaminate the patient given that she had ESBL E  coli UTI.  The plan was to give the patient phosphomycin 3 g every 3 days for a month and then to stop and see if this would allow her to recolonize her urine with other organisms.  With regard to Stenotrophomonas in her sputum, it was felt that this was probably not a pathogen and Levaquin was subsequently discontinued.  The patient was subsequently started on Dulera.  Advair was discontinued.  She remained on 2 L oxygen and went home on same amount of oxygen.  It was felt that if she traveled in the near future, she would need a portable oxygen setup.  With regard to laboratory data, her white blood cell count was within normal limits as was her hemoglobin.  Platelet count 168,000. CMET showed normal chemistries.  HIV antibody was negative.  C difficile PCR stool study negative.  Thus, the patient did well in the hospital and was discharged home in stable condition on January 26, 2011.  She was to be seen in followup by myself, Dr. Shelle Iron, and Dr. Daiva Eves all within a month after discharge.  She will also follow up with Dr. Coralyn Pear in [facility], urologist.  Dr. Malen Gauze was apprised of the patient's situation.  FINAL DIAGNOSES:1. Escherichia coli extended-spectrum beta-lactamase. 2. Chronic obstructive pulmonary disease. 3. Hypertension. 4. Hyperlipidemia. 5. History of renal cell carcinoma of the kidney and ureteral reflux.  CONSULTANTS:  Dr. Daiva Eves, Infectious Diseases; Dr. Shelle Iron, Pulmonary.  Also additional diagnosis of candidiasis (oral) secondary to steroid therapy from previous admission.  DISCHARGE MEDICATIONS:  Please see discharge sheet.  These include his new medications Dulera in addition to Spiriva, new medication of phosphomycin 3 g every 3 days for 30 days.  The patient will also have home oxygen at 2 L nasal prong.     [name] [name], M.D.     MJB/MEDQ  D:  04/04/2011  T:  04/04/2011  Job:  [id number]  Electronically Signed by Marlan Palau M.D. on  04/05/2011 08:55:44 AM

## 2011-04-06 ENCOUNTER — Encounter: Payer: Self-pay | Admitting: Internal Medicine

## 2011-04-06 ENCOUNTER — Ambulatory Visit: Payer: BC Managed Care – PPO | Admitting: Internal Medicine

## 2011-04-06 VITALS — BP 127/80 | HR 76 | Temp 97.7°F | Ht 63.0 in | Wt 141.0 lb

## 2011-04-06 DIAGNOSIS — R300 Dysuria: Secondary | ICD-10-CM

## 2011-04-06 LAB — POCT URINALYSIS DIPSTICK
Bilirubin, UA: NEGATIVE
Blood, UA: NEGATIVE
Glucose, UA: NEGATIVE
Ketones, UA: NEGATIVE
Leukocytes, UA: NEGATIVE
Nitrite, UA: NEGATIVE
Protein, UA: NEGATIVE
Spec Grav, UA: 1.005
Urobilinogen, UA: NEGATIVE
pH, UA: 5

## 2011-04-06 NOTE — Progress Notes (Signed)
  Subjective:    Patient ID: [name], female    DOB: 07-13-1951, 60 y.o.   MRN: [id number]  HPI Pt has felt some back pain and bladder spasms for a few days. Had cystoscopy recently with Dr. Coralyn Pear, urologist, in [place]. Was premedicated with Cipro which was continued for a 5 day course. Hx ESBL E.coli bacturia requiring IV antibiotics and decontamination with Phosphomycin for 30 days. Also hx of moderately severe COPD using home O2. Voice has been a bit hoarse. One cat is sleeping in her bed. Appt to see cardiologist and Dr. Daiva Eves soon. Needs to have lipid panel and liver panel before she sees Dr. [name].   Review of Systems     Objective:   Physical Exam Pharynx and TMs clear. Chest is clear. No oral Candidiasis. U/A is WNL & culture obtained.        Assessment & Plan:  1-COPD- stable 2-Recurrent UTI with Hx ESBL E.coli UTI Spring 2012 Plan: Await urine culture and reassure for now.

## 2011-04-06 NOTE — Progress Notes (Signed)
Pt here today for c/o urinary urgency.  Afebrile. U/A dip WNL. Encouraged pt to push fluids and may use Pyridium OTC prn. Pt instructed to call the clinic if these symptoms worsen or fail to improve as anticipated.

## 2011-04-09 ENCOUNTER — Telehealth: Payer: Self-pay | Admitting: *Deleted

## 2011-04-09 DIAGNOSIS — N390 Urinary tract infection, site not specified: Secondary | ICD-10-CM

## 2011-04-09 LAB — URINE CULTURE: Colony Count: >=100000

## 2011-04-09 MED ORDER — AMOXICILLIN-POT CLAVULANATE 500-125 MG PO TABS: 1.0000 | ORAL_TABLET | Freq: Three times a day (TID) | ORAL | Status: AC

## 2011-04-09 NOTE — Telephone Encounter (Signed)
Spoke with patient about taking new medication for UTI.

## 2011-04-16 ENCOUNTER — Ambulatory Visit (INDEPENDENT_AMBULATORY_CARE_PROVIDER_SITE_OTHER): Payer: BC Managed Care – PPO | Admitting: Infectious Disease

## 2011-04-16 ENCOUNTER — Encounter: Payer: Self-pay | Admitting: Infectious Disease

## 2011-04-16 DIAGNOSIS — B9689 Other specified bacterial agents as the cause of diseases classified elsewhere: Secondary | ICD-10-CM

## 2011-04-16 DIAGNOSIS — N1370 Vesicoureteral-reflux, unspecified: Secondary | ICD-10-CM

## 2011-04-16 DIAGNOSIS — A499 Bacterial infection, unspecified: Secondary | ICD-10-CM

## 2011-04-16 DIAGNOSIS — Z1612 Extended spectrum beta lactamase (ESBL) resistance: Secondary | ICD-10-CM

## 2011-04-16 DIAGNOSIS — N390 Urinary tract infection, site not specified: Secondary | ICD-10-CM

## 2011-04-16 DIAGNOSIS — Z1619 Resistance to other specified beta lactam antibiotics: Secondary | ICD-10-CM

## 2011-04-16 NOTE — Assessment & Plan Note (Signed)
She is status post cystoscopy by Dr. Malen Gauze and getting an ultrasound of the abdomen as well.

## 2011-04-16 NOTE — Assessment & Plan Note (Signed)
Not present on most recent culture but she certainly is highly treatment experience I would certainly worry about such pathogen emerged again with continued antibiotic use.

## 2011-04-16 NOTE — Progress Notes (Signed)
Subjective:    Patient ID: [name], female    DOB: [birth year], 60 y.o.   MRN: [id number]  HPI 60 year old with a hx of nephrectomy renal cell carcinoma sp reimplantation of ureter and  problems of chronic reflux of urine into her kidneys with scarring of  the kidneys, followed by Dr. Malen Gauze in [place]. She was recently hospitalized at [facility] in February and treated during that time for a COPD exacerbation. She had grown ESBL from her urine and had true symptoms concerning for urinary tract infection, was treated with  imipenem and then changed to intravenous ertapenem. Unfortunately, the ertapenem infiltrated her forearm during the week, but the patient didnot have a PICC line while she was getting this. She then was off the Invanz for a number odays and urine was clear, but then on January 17, 2011, came in complaining of tenderness over her bladder and change in color of her urine and she had a positive urinalysis with once again  greater than 100,000 colony forming units of extended spectrum beta lactamase producing E Coli. She was ultimately hospitalized that day to  [facility] where she was placed on imipenem . We ultimately transition her to oral fosfomycin and she was continued on this as an outpatient. She is followed up with her urologist Dr. Malen Gauze in [place]. Dr. Malen Gauze called me last week. I last saw her she underwent cystoscopy by her urologist. Dr. Malen Gauze to place her on ciprofloxacin prior to doing cystoscopy. The patient did well but was having some bladder spasms after she was stopping the ciprofloxacin she developed some abdominal "bladder" discomfort and her urine became discolored. She was concerned she was developing a urinary tract infection she also had some slight low back pain. She was seen by Dr. [name] tube culture the patient's urine and recovered a unusual organism that was actually sensitive to the ciprofloxacin and amoxicillin was the patient  was ultimately placed on. Her symptoms in that sense subsided. She did relatively well. Again we spent quite a bit of time discussing the need for vigilance with antibiotics. Clearly if the patient has severe symptoms such as fevers chills malaise accompanied by her urogenital symptoms then she should obviously have culture obtained in the placed on antibiotics. Some of the more subtle symptoms that she has his of bladder fullness or discomfort in the absence of fever become more difficult in terms of doing a line between initiating antibiotics when they're needed and avoiding them. I would like to avoid them as much as possible given that she is developed highly resistant organisms as well as organisms that are indicative of excess antibiotic use. In always been greater than 45 minutes with the patient including the present time counseling the patient.  Review of Systems   As per history of present illness. Otherwise 12 point review of systems is negative     Objective:   Physical Exam    patient is alert and oriented x4. Her HEENT is normocephalic her pupils are equal round and reactive to light her sclerae anicteric her oropharynx is clear without exudate or thrush. Her neck is supple she has a nasal cannula in her nose. Her cardiovascular exam reveals a regular rate and rhythm no murmurs gallop or rubs heard lungs with prolonged expiratory phase but no wheezes rhonchi or rales abdomen soft nondistended nontender. No CVA tenderness either. Her extremities were with trace edema. Her neurological exam was nonfocal. Skin no rashes.    Assessment &  Plan:

## 2011-04-16 NOTE — Assessment & Plan Note (Signed)
Again I emphasized the need for vigilant use of antibiotics using them where they clearly are needed and trying to avoid them when there is not a clear indication for them. I informed her her urine will always grower isn't as she is calm eyes. She seems fairly well tuned to her symptoms although we may be in the habit of treating her low but to promptly. Unfortunately there is not much other interventions that can be done to reduce the the conization. Again my goal would be to avoid antibiotics as much as possible to help encourage the emergency room or antibiotics susceptible flora into her GU tract. She's going on a trip to isolate it and it q. months I would like to have an empiric antibiotic on hand in case she develops symptoms of urinary tract infection I have suggested th fosfomycin  . She did take 6 pills and this would give her enough therapy for a complicated urinary tract infection including with an ESBL

## 2011-04-26 ENCOUNTER — Other Ambulatory Visit: Payer: BC Managed Care – PPO | Admitting: Internal Medicine

## 2011-04-26 DIAGNOSIS — E785 Hyperlipidemia, unspecified: Secondary | ICD-10-CM

## 2011-04-27 LAB — LIPID PANEL
Cholesterol: 188 mg/dL (ref 0–200)
HDL: 51 mg/dL (ref >39)
LDL Cholesterol: 119 mg/dL — ABNORMAL HIGH (ref 0–99)
Total CHOL/HDL Ratio: 3.7 Ratio
Triglycerides: 92 mg/dL (ref <150)
VLDL: 18 mg/dL (ref 0–40)

## 2011-04-30 ENCOUNTER — Encounter: Payer: Self-pay | Admitting: Internal Medicine

## 2011-05-02 ENCOUNTER — Encounter: Payer: Self-pay | Admitting: Cardiology

## 2011-05-02 ENCOUNTER — Ambulatory Visit (INDEPENDENT_AMBULATORY_CARE_PROVIDER_SITE_OTHER): Payer: BC Managed Care – PPO | Admitting: Cardiology

## 2011-05-02 VITALS — BP 110/78 | HR 62 | Ht 63.0 in | Wt 141.0 lb

## 2011-05-02 DIAGNOSIS — E785 Hyperlipidemia, unspecified: Secondary | ICD-10-CM

## 2011-05-02 DIAGNOSIS — J449 Chronic obstructive pulmonary disease, unspecified: Secondary | ICD-10-CM

## 2011-05-02 DIAGNOSIS — I2510 Atherosclerotic heart disease of native coronary artery without angina pectoris: Secondary | ICD-10-CM

## 2011-05-02 DIAGNOSIS — I10 Essential (primary) hypertension: Secondary | ICD-10-CM

## 2011-05-02 NOTE — Assessment & Plan Note (Signed)
Now on O2 and this has helped as she gets hypoxemic with activity.

## 2011-05-02 NOTE — Assessment & Plan Note (Signed)
No current symptoms

## 2011-05-02 NOTE — Assessment & Plan Note (Signed)
Improved with the addition of an ARB.

## 2011-05-02 NOTE — Patient Instructions (Signed)
Your physician wants you to follow-up in: 1 year  You will receive a reminder letter in the mail two months in advance. If you don't receive a letter, please call our office to schedule the follow-up appointment.  Your physician recommends that you continue on your current medications as directed. Please refer to the Current Medication list given to you today.  

## 2011-05-02 NOTE — Assessment & Plan Note (Signed)
Followed by Dr. [name].  See latest result.

## 2011-05-02 NOTE — Progress Notes (Signed)
HPI:  She is in for follow up after a hospitalization times two, with COPD flare,and also UTI with a resistant organism.  Is followed by Dr. Daiva Eves.  No chest pain.  Had her BP treated more aggressively by Dr. Sharlet Salina.  Now on home O2.  Getting ready to go to [place], and determined.  No current symptoms.  Current Outpatient Prescriptions  Medication Sig Dispense Refill  . albuterol (PROAIR HFA) 108 (90 BASE) MCG/ACT inhaler Inhale 2 puffs into the lungs every 6 (six) hours as needed.        . ALPRAZolam (XANAX) 0.25 MG tablet Take 0.25 mg by mouth 3 (three) times daily as needed.        . Ascorbic Acid (VITAMIN C) 1000 MG tablet Take 1,000 mg by mouth daily.        Marland Kitchen aspirin 325 MG tablet Take 325 mg by mouth daily.        Marland Kitchen atenolol (TENORMIN) 25 MG tablet Take 25 mg by mouth daily.        . AZO-CRANBERRY PO Take 1 tablet by mouth daily.        Marland Kitchen Bioflavonoid Products (ESTER-C) 1000-50 MG TABS Take 1 tablet by mouth daily.        . butalbital-aspirin-caffeine (FIORINAL) 50-325-40 MG per capsule Take 1 capsule by mouth every 4 (four) hours as needed for headache (Take one to two tabs po q 4-6 hours PRN HA). Take 1 to 2 capsules every 4 to 6 hours for headache.  60 capsule  3  . cetirizine (ZYRTEC) 10 MG tablet Take 10 mg by mouth daily.       . chlorpheniramine-HYDROcodone (TUSSIONEX PENNKINETIC ER) 10-8 MG/5ML LQCR Take 5 mLs by mouth every 12 (twelve) hours as needed.       . Coenzyme Q10 (COQ10) 100 MG CAPS Take 1 tablet by mouth daily.        Marland Kitchen estradiol (VAGIFEM) 25 MCG vaginal tablet Insert one vaginally every 3 days       . FOSFOMYCIN TROMETHAMINE PO 1 dose ( 3 grams)  By mouth every 3 days       . GuaiFENesin (MUCINEX PO) Take by mouth as needed.        . hydrochlorothiazide 25 MG tablet Take 25 mg by mouth daily.        Marland Kitchen levalbuterol (XOPENEX) 0.63 MG/3ML nebulizer solution Take 1 ampule by nebulization every 6 (six) hours as needed. (unsure of dosage)       . losartan  (COZAAR) 50 MG tablet Take 25 mg by mouth daily.       . Mometasone Furo-Formoterol Fum (DULERA) 100-5 MCG/ACT AERO Inhale 2 puffs into the lungs 2 (two) times daily.        . Multiple Vitamin (MULTIVITAMIN PO) Take 1 tablet by mouth daily.        . nitroGLYCERIN (NITROSTAT) 0.4 MG SL tablet Place 0.4 mg under the tongue every 5 (five) minutes as needed.        . pantoprazole (PROTONIX) 40 MG tablet Take 40 mg by mouth 2 (two) times daily.        . rosuvastatin (CRESTOR) 20 MG tablet Take 20 mg by mouth daily.        Marland Kitchen saccharomyces boulardii (FLORASTOR) 250 MG capsule Take 250 mg by mouth 2 (two) times daily.        Marland Kitchen tiotropium (SPIRIVA) 18 MCG inhalation capsule Place 18 mcg into inhaler and inhale daily.        Marland Kitchen  triamcinolone (NASACORT) 55 MCG/ACT nasal inhaler 1 spray by Nasal route daily.       . valACYclovir (VALTREX) 500 MG tablet Take 500 mg by mouth daily.        . Zinc 100 MG TABS Take 1 tablet by mouth daily.        Marland Kitchen zolpidem (AMBIEN) 10 MG tablet Take 5 mg by mouth at bedtime as needed.          Allergies  Allergen Reactions  . Thorazine (Chlorpromazine Hcl)     Past Medical History  Diagnosis Date  . Renal cell cancer     (Lt) partial nephrectomy dx 11/06  . Emphysema   . Hypertension   . Chest pain   . Dyspnea   . CAD (coronary artery disease)   . Hyperlipidemia   . Headache   . Goiter   . COPD (chronic obstructive pulmonary disease)   . Asthma   . Allergic rhinitis     Past Surgical History  Procedure Date  . Cesarean section     x 3  . Nephrectomy     partial  . Uretheral re-implantation     Family History  Problem Relation Age of Onset  . Hypothyroidism Mother     History   Social History  . Marital Status: Married    Spouse Name: N/A    Number of Children: N/A  . Years of Education: N/A   Occupational History  . retired    Social History Main Topics  . Smoking status: Former Smoker    Quit date: 11/05/2000  . Smokeless tobacco:  Never Used   Comment: started at age 36.  2 ppd.   . Alcohol Use: No  . Drug Use: No  . Sexually Active: Not on file   Other Topics Concern  . Not on file   Social History Narrative  . No narrative on file    ROS: Please see the HPI.  All other systems reviewed and negative.  PHYSICAL EXAM:  BP 110/78  Pulse 62  Ht 5\' 3"  (1.6 m)  Wt 141 lb (63.957 kg)  BMI 24.98 kg/m2  General: Well developed, well nourished, in no acute distress. Head:  Normocephalic and atraumatic.  Nasal O2 in place. Neck: no JVD Lungs: Clear to auscultation and percussion.  Slightly prolonged expiration. Heart: Normal S1 and paradoxical S2.  No def murmur.   Abdomen:  Normal bowel sounds; soft; non tender; no organomegaly Pulses: Pulses normal in all 4 extremities. Extremities: No clubbing or cyanosis. No edema. Neurologic: Alert and oriented x 3.  EKG:  NSR.  LBBB.  ASSESSMENT AND PLAN:

## 2011-05-07 ENCOUNTER — Telehealth: Payer: Self-pay | Admitting: Pulmonary Disease

## 2011-05-07 NOTE — Telephone Encounter (Signed)
Spoke with pt and she states just needing her dx code for o2. I advised COPD- 496. Pt verbalized understanding and states nothing further needed.

## 2011-05-14 ENCOUNTER — Encounter: Payer: Self-pay | Admitting: Pulmonary Disease

## 2011-05-14 ENCOUNTER — Ambulatory Visit (INDEPENDENT_AMBULATORY_CARE_PROVIDER_SITE_OTHER): Payer: BC Managed Care – PPO | Admitting: Pulmonary Disease

## 2011-05-14 VITALS — BP 116/72 | HR 68 | Temp 98.2°F | Ht 63.0 in | Wt 144.4 lb

## 2011-05-14 DIAGNOSIS — J4489 Other specified chronic obstructive pulmonary disease: Secondary | ICD-10-CM

## 2011-05-14 DIAGNOSIS — J449 Chronic obstructive pulmonary disease, unspecified: Secondary | ICD-10-CM

## 2011-05-14 NOTE — Patient Instructions (Signed)
No change in meds. Please call me once you get back in town so we can set up pulmonary rehab Take your avelox and prednisone with you on trip (avelox one each day for 5 days, prednisone taper as directed.) followup with me in 6mos if doing well.

## 2011-05-14 NOTE — Progress Notes (Signed)
  Subjective:    Patient ID: [name], female    DOB: 04/17/1951, 60 y.o.   MRN: [id number]  HPI The pt comes in today for f/u of her known moderately severe emphysema with chronic respiratory failure.  She has finally gotten back to her usual baseline after a significant acute exacerbation earlier in the year.  She is more active, and denies any chest congestion or cough with purulence.  She is to enroll in pulmonary rehab once she gets back from her vacation.     Review of Systems  Constitutional: Negative for fever and unexpected weight change.  HENT: Negative for ear pain, nosebleeds, congestion, sore throat, rhinorrhea, sneezing, trouble swallowing, dental problem, postnasal drip and sinus pressure.   Eyes: Negative for redness and itching.  Respiratory: Negative for cough, chest tightness, shortness of breath and wheezing.   Cardiovascular: Positive for leg swelling. Negative for palpitations.  Gastrointestinal: Negative for nausea and vomiting.  Genitourinary: Negative for dysuria.  Musculoskeletal: Negative for joint swelling.  Skin: Negative for rash.  Neurological: Negative for headaches.  Hematological: Bruises/bleeds easily.  Psychiatric/Behavioral: Negative for dysphoric mood. The patient is not nervous/anxious.        Objective:   Physical Exam Wd female in nad Chest with decreased bs, no wheezing or rhonchi Cor with rrr LE with no edema, no cyanosis noted. Alert, oriented, moves all 4        Assessment & Plan:

## 2011-05-14 NOTE — Assessment & Plan Note (Signed)
The pt is doing very well from a pulmonary standpoint, and appears to be stable for her upcoming trip to [place].  She has avelox and prednisone available in the event of acute exacerbation.  I have asked her to stay on current pulmonary meds, and to call us once she gets back so we can arrange a pulmonary rehab referral.

## 2011-07-11 ENCOUNTER — Other Ambulatory Visit: Payer: Self-pay | Admitting: Internal Medicine

## 2011-08-11 ENCOUNTER — Other Ambulatory Visit: Payer: Self-pay | Admitting: Internal Medicine

## 2011-08-11 LAB — URINALYSIS, ROUTINE W REFLEX MICROSCOPIC
Bilirubin Urine: NEGATIVE
Glucose, UA: NEGATIVE mg/dL
Ketones, ur: NEGATIVE mg/dL
Nitrite: POSITIVE — AB
Protein, ur: NEGATIVE mg/dL
Specific Gravity, Urine: 1.017 (ref 1.005–1.030)
Urobilinogen, UA: 0.2 mg/dL (ref 0.0–1.0)
pH: 6 (ref 5.0–8.0)

## 2011-08-11 LAB — URINALYSIS, MICROSCOPIC ONLY
Casts: NONE SEEN
Crystals: NONE SEEN

## 2011-08-15 LAB — URINE CULTURE: Colony Count: >=100000

## 2011-08-16 LAB — D-DIMER, QUANTITATIVE: D-Dimer, Quant: <0.22

## 2011-08-17 ENCOUNTER — Telehealth: Payer: Self-pay | Admitting: Internal Medicine

## 2011-08-17 ENCOUNTER — Telehealth: Payer: Self-pay | Admitting: Pulmonary Disease

## 2011-08-17 ENCOUNTER — Other Ambulatory Visit: Payer: Self-pay | Admitting: Pulmonary Disease

## 2011-08-17 DIAGNOSIS — J449 Chronic obstructive pulmonary disease, unspecified: Secondary | ICD-10-CM

## 2011-08-17 NOTE — Telephone Encounter (Signed)
Pt is back in town and ready to be set up with pulmonary rehab. Per OV note from 05/14/2011, pt was to call with a reminder for [facility] about setting this up for her. Order sent to [facility]. Will forward to [facility] so he is aware.

## 2011-08-17 NOTE — Telephone Encounter (Signed)
Patient called on Saturday, October 6 mid day complaining of UTI symptoms. Her husband brought a sterile urine specimen to the office around 1 PM which was sent to [facility] for urinalysis and culture. Culture subsequently grew greater than 100,000 col/ml Klebsiella species with Intermediate sensitivity to Macrobid which she was placed on on 08/11/2011. She has been instructed to have repeat urinalysis the week of October 15. Patient says she is better. Has had significant antibiotic resistance to urinary tract infections during the spring of 2012 which was treated with a decontamination regimen with fosfomycin which seemingly has worked.

## 2011-08-24 ENCOUNTER — Ambulatory Visit (INDEPENDENT_AMBULATORY_CARE_PROVIDER_SITE_OTHER): Payer: BC Managed Care – PPO | Admitting: Internal Medicine

## 2011-08-24 DIAGNOSIS — N390 Urinary tract infection, site not specified: Secondary | ICD-10-CM

## 2011-08-24 DIAGNOSIS — J449 Chronic obstructive pulmonary disease, unspecified: Secondary | ICD-10-CM

## 2011-08-24 DIAGNOSIS — Z23 Encounter for immunization: Secondary | ICD-10-CM

## 2011-08-24 LAB — POCT URINALYSIS DIPSTICK
Bilirubin, UA: NEGATIVE
Blood, UA: NEGATIVE
Glucose, UA: NEGATIVE
Ketones, UA: NEGATIVE
Nitrite, UA: NEGATIVE
Protein, UA: NEGATIVE
Spec Grav, UA: 1.01
Urobilinogen, UA: NEGATIVE
pH, UA: 5

## 2011-08-24 NOTE — Patient Instructions (Signed)
Call if symptoms recur. 

## 2011-08-24 NOTE — Progress Notes (Signed)
  Subjective:    Patient ID: [name], female    DOB: [birth year], 60 y.o.   MRN: [id number]  HPI for followup of recent urinary tract infection. Currently asymptomatic. Urinalysis initially dipped clear today but after to manage showed up with some LE. Patient never has a completely clear urine. Says that Macrobid caused her to have some burning in her chest and possible shortness of breath.    Review of Systems     Objective:   Physical Exam not examined-see urinalysis.        Assessment & Plan:  Plan: Urine was not recultured today since she is asymptomatic. Return when necessary. 4 COPD, new prescription written for Spiriva 90 day supply with when necessary 1 year refill which she mails off to [place]

## 2011-08-27 ENCOUNTER — Other Ambulatory Visit: Payer: Self-pay | Admitting: Internal Medicine

## 2011-09-12 ENCOUNTER — Encounter (HOSPITAL_COMMUNITY): Payer: Self-pay

## 2011-09-12 ENCOUNTER — Encounter (HOSPITAL_COMMUNITY): Payer: BC Managed Care – PPO

## 2011-09-12 DIAGNOSIS — E042 Nontoxic multinodular goiter: Secondary | ICD-10-CM | POA: Insufficient documentation

## 2011-09-12 DIAGNOSIS — E785 Hyperlipidemia, unspecified: Secondary | ICD-10-CM | POA: Insufficient documentation

## 2011-09-12 DIAGNOSIS — J449 Chronic obstructive pulmonary disease, unspecified: Secondary | ICD-10-CM | POA: Insufficient documentation

## 2011-09-12 DIAGNOSIS — Z5189 Encounter for other specified aftercare: Secondary | ICD-10-CM | POA: Insufficient documentation

## 2011-09-12 DIAGNOSIS — I10 Essential (primary) hypertension: Secondary | ICD-10-CM | POA: Insufficient documentation

## 2011-09-12 DIAGNOSIS — J4489 Other specified chronic obstructive pulmonary disease: Secondary | ICD-10-CM | POA: Insufficient documentation

## 2011-09-12 DIAGNOSIS — I2510 Atherosclerotic heart disease of native coronary artery without angina pectoris: Secondary | ICD-10-CM | POA: Insufficient documentation

## 2011-09-12 DIAGNOSIS — I447 Left bundle-branch block, unspecified: Secondary | ICD-10-CM | POA: Insufficient documentation

## 2011-09-12 NOTE — Progress Notes (Signed)
Safety and hand hygiene in the exercise area reviewed with patient.  Patient voices understanding.Demonstration and practice of PLB using pulse oximeter.  Patient able to return demonstration satisfactorily.  

## 2011-09-13 ENCOUNTER — Encounter (HOSPITAL_COMMUNITY): Payer: BC Managed Care – PPO

## 2011-09-13 NOTE — Progress Notes (Signed)
First day of exercise in [facility].  Orientation to equipment use and safety, RPE and Dyspnea scale, rest breaks.   Demonstration and practice of PLB  At each exercise station.  Tolerated exercise well

## 2011-09-18 ENCOUNTER — Encounter (HOSPITAL_COMMUNITY): Payer: BC Managed Care – PPO

## 2011-09-20 ENCOUNTER — Encounter (HOSPITAL_COMMUNITY)
Admission: RE | Admit: 2011-09-20 | Discharge: 2011-09-20 | Disposition: A | Discharge status: Home / Self Care | Payer: BC Managed Care – PPO | Source: Ambulatory Visit | Attending: Pulmonary Disease | Admitting: Pulmonary Disease

## 2011-09-20 NOTE — Progress Notes (Signed)
Completed home exercise with patient. Discussed exercise progression, type of safe exercises patient should do, patients' routine- (warm up, activity period, cool down), RPE/Dyspnea Scale, how important it is to have a pulse oximeter with her at all times, environmental factors, warning signs and symptoms and when to call the MD. Patient voiced understanding. Patients' long term goal is to reach speed of 2.1 on the treadmill while maintaining SPAO2 greater than or equal to 90% on 2L. Will continue to support and encourage.

## 2011-09-24 ENCOUNTER — Telehealth: Payer: Self-pay | Admitting: Pulmonary Disease

## 2011-09-24 NOTE — Telephone Encounter (Signed)
I spoke with pt and she states she would like an order sent to [facility] to p/u her concentrator and portable oxygen. Pt states she bought her own equipment. Pt states she wants this to be picked up at the end of December bc her insurance covered it until then. Please advise Dr. Shelle Iron, thanks

## 2011-09-24 NOTE — Telephone Encounter (Signed)
Have her call us toward the end of DEC and we can send an order.   Let her know if she has her own equipment, make sure she has a way to get it fixed if something happens.

## 2011-09-25 ENCOUNTER — Encounter (HOSPITAL_COMMUNITY)
Admission: RE | Admit: 2011-09-25 | Discharge: 2011-09-25 | Disposition: A | Discharge status: Home / Self Care | Payer: BC Managed Care – PPO | Source: Ambulatory Visit | Attending: Pulmonary Disease | Admitting: Pulmonary Disease

## 2011-09-25 NOTE — Telephone Encounter (Signed)
I spoke with pt and she states she will call back toward end of December so we can send order. She states the company she got her equipment through will service her machine and on her portable she has a lifetime guarantee.

## 2011-09-26 ENCOUNTER — Telehealth: Payer: Self-pay | Admitting: Internal Medicine

## 2011-09-26 MED ORDER — TERCONAZOLE 0.4 % VA CREA: 1.0000 | TOPICAL_CREAM | Freq: Every day | VAGINAL | Status: AC

## 2011-09-26 NOTE — Telephone Encounter (Signed)
Call in Terazol vaginal Cream qhs x 7days one tube with applicator with prn one year refills

## 2011-09-27 ENCOUNTER — Encounter (HOSPITAL_COMMUNITY): Payer: BC Managed Care – PPO

## 2011-10-02 ENCOUNTER — Encounter (HOSPITAL_COMMUNITY)
Admission: RE | Admit: 2011-10-02 | Discharge: 2011-10-02 | Disposition: A | Discharge status: Home / Self Care | Payer: BC Managed Care – PPO | Source: Ambulatory Visit | Attending: Pulmonary Disease | Admitting: Pulmonary Disease

## 2011-10-02 ENCOUNTER — Ambulatory Visit (INDEPENDENT_AMBULATORY_CARE_PROVIDER_SITE_OTHER): Payer: BC Managed Care – PPO | Admitting: Internal Medicine

## 2011-10-02 DIAGNOSIS — N390 Urinary tract infection, site not specified: Secondary | ICD-10-CM

## 2011-10-02 DIAGNOSIS — B9689 Other specified bacterial agents as the cause of diseases classified elsewhere: Secondary | ICD-10-CM

## 2011-10-02 DIAGNOSIS — Z1619 Resistance to other specified beta lactam antibiotics: Secondary | ICD-10-CM

## 2011-10-02 DIAGNOSIS — Z1612 Extended spectrum beta lactamase (ESBL) resistance: Secondary | ICD-10-CM

## 2011-10-02 DIAGNOSIS — A499 Bacterial infection, unspecified: Secondary | ICD-10-CM

## 2011-10-02 LAB — POCT URINALYSIS DIPSTICK
Bilirubin, UA: NEGATIVE
Blood, UA: NEGATIVE
Glucose, UA: NEGATIVE
Ketones, UA: NEGATIVE
Leukocytes, UA: NEGATIVE
Nitrite, UA: NEGATIVE
Protein, UA: NEGATIVE
Spec Grav, UA: 1.01
Urobilinogen, UA: NEGATIVE
pH, UA: 5.5

## 2011-10-02 NOTE — Progress Notes (Signed)
Addended by: Judy Pimple on: 10/02/2011 04:02 PM   Modules accepted: Orders

## 2011-10-02 NOTE — Progress Notes (Signed)
  Subjective:    Patient ID: [name], female    DOB: 04-01-1951, 59 y.o.   MRN: [id number]  HPI    Review of Systems     Objective:   Physical Exam        Assessment & Plan:

## 2011-10-02 NOTE — Progress Notes (Signed)
  Subjective:    Patient ID: [name], female    DOB: [birth year], 60 y.o.   MRN: [id number]  HPI  patient called on November 21 saying that she had been diagnosed by urologist with another urinary tract infection with a resistant organism ESBL. Urologist placed her on fosfomycin. Patient wanted to come here today to get recheck on urine instead of going back to urologist in [place]. Was seen for a nurse visit. Urinalysis is now clear.    Review of Systems     Objective:   Physical Exam not examined seen for nurse visit only        Assessment & Plan:  Recurrent UTI with ESBL successfully treated with fosfomycin. Plan: Will send copy of this note to Dr. Malen Gauze, urologist.

## 2011-10-02 NOTE — Patient Instructions (Signed)
We will inform Dr. Malen Gauze of today's results. Please call her for further instructions.

## 2011-10-04 ENCOUNTER — Encounter (HOSPITAL_COMMUNITY)
Admission: RE | Admit: 2011-10-04 | Discharge: 2011-10-04 | Disposition: A | Discharge status: Home / Self Care | Payer: BC Managed Care – PPO | Source: Ambulatory Visit | Attending: Pulmonary Disease | Admitting: Pulmonary Disease

## 2011-10-09 ENCOUNTER — Encounter (HOSPITAL_COMMUNITY)
Admission: RE | Admit: 2011-10-09 | Discharge: 2011-10-09 | Disposition: A | Discharge status: Home / Self Care | Payer: BC Managed Care – PPO | Source: Ambulatory Visit | Attending: Pulmonary Disease | Admitting: Pulmonary Disease

## 2011-10-09 DIAGNOSIS — I10 Essential (primary) hypertension: Secondary | ICD-10-CM | POA: Insufficient documentation

## 2011-10-09 DIAGNOSIS — I447 Left bundle-branch block, unspecified: Secondary | ICD-10-CM | POA: Insufficient documentation

## 2011-10-09 DIAGNOSIS — E042 Nontoxic multinodular goiter: Secondary | ICD-10-CM | POA: Insufficient documentation

## 2011-10-09 DIAGNOSIS — E785 Hyperlipidemia, unspecified: Secondary | ICD-10-CM | POA: Insufficient documentation

## 2011-10-09 DIAGNOSIS — I2510 Atherosclerotic heart disease of native coronary artery without angina pectoris: Secondary | ICD-10-CM | POA: Insufficient documentation

## 2011-10-09 DIAGNOSIS — J449 Chronic obstructive pulmonary disease, unspecified: Secondary | ICD-10-CM | POA: Insufficient documentation

## 2011-10-09 DIAGNOSIS — Z5189 Encounter for other specified aftercare: Secondary | ICD-10-CM | POA: Insufficient documentation

## 2011-10-09 DIAGNOSIS — J4489 Other specified chronic obstructive pulmonary disease: Secondary | ICD-10-CM | POA: Insufficient documentation

## 2011-10-11 ENCOUNTER — Encounter (HOSPITAL_COMMUNITY)
Admission: RE | Admit: 2011-10-11 | Discharge: 2011-10-11 | Disposition: A | Discharge status: Home / Self Care | Payer: BC Managed Care – PPO | Source: Ambulatory Visit | Attending: Pulmonary Disease | Admitting: Pulmonary Disease

## 2011-10-15 ENCOUNTER — Telehealth: Payer: Self-pay | Admitting: Pulmonary Disease

## 2011-10-15 DIAGNOSIS — J449 Chronic obstructive pulmonary disease, unspecified: Secondary | ICD-10-CM

## 2011-10-15 NOTE — Telephone Encounter (Signed)
Spoke with the pt and she states she wants order faxed to [facility] to have oxygen p/u. See phone note from 09-24-11, per [facility] ok to send order. Order sent to [facility]. Pt aware.Carron Curie, CMA

## 2011-10-16 ENCOUNTER — Encounter (HOSPITAL_COMMUNITY)
Admission: RE | Admit: 2011-10-16 | Discharge: 2011-10-16 | Disposition: A | Discharge status: Home / Self Care | Payer: BC Managed Care – PPO | Source: Ambulatory Visit | Attending: Pulmonary Disease | Admitting: Pulmonary Disease

## 2011-10-18 ENCOUNTER — Encounter (HOSPITAL_COMMUNITY)
Admission: RE | Admit: 2011-10-18 | Discharge: 2011-10-18 | Disposition: A | Discharge status: Home / Self Care | Payer: BC Managed Care – PPO | Source: Ambulatory Visit | Attending: Pulmonary Disease | Admitting: Pulmonary Disease

## 2011-10-23 ENCOUNTER — Encounter (HOSPITAL_COMMUNITY)
Admission: RE | Admit: 2011-10-23 | Discharge: 2011-10-23 | Disposition: A | Discharge status: Home / Self Care | Payer: BC Managed Care – PPO | Source: Ambulatory Visit | Attending: Pulmonary Disease | Admitting: Pulmonary Disease

## 2011-10-25 ENCOUNTER — Encounter (HOSPITAL_COMMUNITY)
Admission: RE | Admit: 2011-10-25 | Discharge: 2011-10-25 | Disposition: A | Discharge status: Home / Self Care | Payer: BC Managed Care – PPO | Source: Ambulatory Visit | Attending: Pulmonary Disease | Admitting: Pulmonary Disease

## 2011-10-30 ENCOUNTER — Encounter (HOSPITAL_COMMUNITY): Payer: BC Managed Care – PPO

## 2011-11-01 ENCOUNTER — Encounter (HOSPITAL_COMMUNITY): Payer: BC Managed Care – PPO

## 2011-11-06 ENCOUNTER — Encounter (HOSPITAL_COMMUNITY): Payer: BC Managed Care – PPO

## 2011-11-08 ENCOUNTER — Encounter (HOSPITAL_COMMUNITY): Payer: BC Managed Care – PPO

## 2011-11-08 DIAGNOSIS — E785 Hyperlipidemia, unspecified: Secondary | ICD-10-CM | POA: Insufficient documentation

## 2011-11-08 DIAGNOSIS — J449 Chronic obstructive pulmonary disease, unspecified: Secondary | ICD-10-CM | POA: Insufficient documentation

## 2011-11-08 DIAGNOSIS — I2510 Atherosclerotic heart disease of native coronary artery without angina pectoris: Secondary | ICD-10-CM | POA: Insufficient documentation

## 2011-11-08 DIAGNOSIS — Z5189 Encounter for other specified aftercare: Secondary | ICD-10-CM | POA: Insufficient documentation

## 2011-11-08 DIAGNOSIS — J4489 Other specified chronic obstructive pulmonary disease: Secondary | ICD-10-CM | POA: Insufficient documentation

## 2011-11-08 DIAGNOSIS — I447 Left bundle-branch block, unspecified: Secondary | ICD-10-CM | POA: Insufficient documentation

## 2011-11-08 DIAGNOSIS — E042 Nontoxic multinodular goiter: Secondary | ICD-10-CM | POA: Insufficient documentation

## 2011-11-08 DIAGNOSIS — I10 Essential (primary) hypertension: Secondary | ICD-10-CM | POA: Insufficient documentation

## 2011-11-09 ENCOUNTER — Encounter: Payer: Self-pay | Admitting: Internal Medicine

## 2011-11-09 ENCOUNTER — Ambulatory Visit (INDEPENDENT_AMBULATORY_CARE_PROVIDER_SITE_OTHER): Payer: BC Managed Care – PPO | Admitting: Internal Medicine

## 2011-11-09 VITALS — BP 140/82 | HR 84 | Temp 97.4°F | Ht 63.0 in | Wt 157.0 lb

## 2011-11-09 DIAGNOSIS — J329 Chronic sinusitis, unspecified: Secondary | ICD-10-CM

## 2011-11-09 DIAGNOSIS — I10 Essential (primary) hypertension: Secondary | ICD-10-CM

## 2011-11-09 DIAGNOSIS — Z87440 Personal history of urinary (tract) infections: Secondary | ICD-10-CM

## 2011-11-09 DIAGNOSIS — J449 Chronic obstructive pulmonary disease, unspecified: Secondary | ICD-10-CM

## 2011-11-09 DIAGNOSIS — J4489 Other specified chronic obstructive pulmonary disease: Secondary | ICD-10-CM

## 2011-11-09 NOTE — Progress Notes (Signed)
  Subjective:    Patient ID: [name], female    DOB: 09-15-1951, 61 y.o.   MRN: [id number]  HPI  61 year old white female with history of COPD, allergic rhinitis, recurrent urinary tract infections and urinary reflux in today with URI symptoms that had onset December 25 while visiting relatives out-of-town. Has grayish sputum production. Has had temperature up to 101. Did take influenza immunization. Husband has had similar illness but has gotten better. Patient is on 3 L home oxygen. Pulse oximetry is 98% on 3 L oxygen. A lot of coughing. Has been taking some leftover Tussionex. Sounds hoarse. Currently currently no UTI symptoms but she wants her urinalysis checked today. Requests something for dysuria and bladder pressure. Has been going to pulmonary rehabilitation. They suggested she come see physician today.    Review of Systems     Objective:   Physical Exam HEENT exam: TMs and pharynx are clear; neck is supple. No thyromegaly or adenopathy. Chest clear.dictation. Cough is very congested.        Assessment & Plan:  Sinusitis  Bronchitis  Plan: Augmentin 500 mg 3 times daily for 10 days. Tussionex 8 ounces generic 1 teaspoon by mouth every 12 hours when necessary cough. Urinalysis today is entirely within normal limits. Prescribed Pyridium 200 mg #90 one by mouth 3 times a day as needed for bladder pressure.

## 2011-11-09 NOTE — Patient Instructions (Addendum)
Take Augmentin 500 mg 3 times daily for 10 days for respiratory infection. Have prescribed Tussionex 8 ounces 1 teaspoon every 12 hours as needed for cough. Urinalysis checked today is completely within normal limits.

## 2011-11-13 ENCOUNTER — Encounter (HOSPITAL_COMMUNITY)
Admission: RE | Admit: 2011-11-13 | Discharge: 2011-11-13 | Disposition: A | Discharge status: Home / Self Care | Payer: BC Managed Care – PPO | Source: Ambulatory Visit | Attending: Pulmonary Disease | Admitting: Pulmonary Disease

## 2011-11-13 ENCOUNTER — Encounter (HOSPITAL_COMMUNITY): Payer: BC Managed Care – PPO

## 2011-11-15 ENCOUNTER — Encounter (HOSPITAL_COMMUNITY)
Admission: RE | Admit: 2011-11-15 | Payer: BC Managed Care – PPO | Source: Ambulatory Visit | Attending: Pulmonary Disease | Admitting: Pulmonary Disease

## 2011-11-15 ENCOUNTER — Ambulatory Visit: Payer: BC Managed Care – PPO | Admitting: Pulmonary Disease

## 2011-11-15 ENCOUNTER — Encounter (HOSPITAL_COMMUNITY): Payer: BC Managed Care – PPO

## 2011-11-20 ENCOUNTER — Encounter (HOSPITAL_COMMUNITY)
Admission: RE | Admit: 2011-11-20 | Discharge: 2011-11-20 | Disposition: A | Discharge status: Home / Self Care | Payer: BC Managed Care – PPO | Source: Ambulatory Visit | Attending: Pulmonary Disease | Admitting: Pulmonary Disease

## 2011-11-20 ENCOUNTER — Encounter (HOSPITAL_COMMUNITY): Payer: BC Managed Care – PPO

## 2011-11-22 ENCOUNTER — Encounter (HOSPITAL_COMMUNITY): Payer: BC Managed Care – PPO

## 2011-11-22 ENCOUNTER — Encounter (HOSPITAL_COMMUNITY)
Admission: RE | Admit: 2011-11-22 | Discharge: 2011-11-22 | Disposition: A | Discharge status: Home / Self Care | Payer: BC Managed Care – PPO | Source: Ambulatory Visit | Attending: Pulmonary Disease | Admitting: Pulmonary Disease

## 2011-11-27 ENCOUNTER — Encounter (HOSPITAL_COMMUNITY)
Admission: RE | Admit: 2011-11-27 | Discharge: 2011-11-27 | Disposition: A | Discharge status: Home / Self Care | Payer: BC Managed Care – PPO | Source: Ambulatory Visit | Attending: Pulmonary Disease | Admitting: Pulmonary Disease

## 2011-11-27 ENCOUNTER — Encounter (HOSPITAL_COMMUNITY): Payer: BC Managed Care – PPO

## 2011-11-29 ENCOUNTER — Encounter (HOSPITAL_COMMUNITY)
Admission: RE | Admit: 2011-11-29 | Discharge: 2011-11-29 | Disposition: A | Discharge status: Home / Self Care | Payer: BC Managed Care – PPO | Source: Ambulatory Visit | Attending: Pulmonary Disease | Admitting: Pulmonary Disease

## 2011-11-29 ENCOUNTER — Encounter (HOSPITAL_COMMUNITY): Payer: BC Managed Care – PPO

## 2011-11-30 ENCOUNTER — Other Ambulatory Visit: Payer: Self-pay | Admitting: Internal Medicine

## 2011-12-03 ENCOUNTER — Other Ambulatory Visit: Payer: Self-pay

## 2011-12-03 MED ORDER — BUTALBITAL-ASA-CAFFEINE 50-325-40 MG PO CAPS: 1.0000 | ORAL_CAPSULE | Freq: Four times a day (QID) | ORAL | Status: DC | PRN

## 2011-12-04 ENCOUNTER — Encounter (HOSPITAL_COMMUNITY): Payer: BC Managed Care – PPO

## 2011-12-04 ENCOUNTER — Encounter (HOSPITAL_COMMUNITY)
Admission: RE | Admit: 2011-12-04 | Discharge: 2011-12-04 | Disposition: A | Discharge status: Home / Self Care | Payer: BC Managed Care – PPO | Source: Ambulatory Visit | Attending: Pulmonary Disease | Admitting: Pulmonary Disease

## 2011-12-05 NOTE — Progress Notes (Signed)
Pulmonary Rehabilitation Program Progress Report   Orientation:  09/12/2011 Graduate Date:10/25/2011 Discharge Date:  10/25/2011  # of sessions completed: 12 out of 12   Pulmonologist: Clance Class Time:  1:30  A.  Exercise Program:  Tolerates exercise @ 2,45 METS for 45 minutes, Walk Test Results:  Pre: 835 ft and Post: 1000 ft, Improved functional capacity  19.76 %, Decreased  muscular strength  10.34 %, Improved dyspnea score 64 %, Improved education score 8.33 % and Patient plans to continue pulmonary rehab maintenance program  B.  Mental Health:  Good mental attitude and Quality of Life (QOL)  changes:  Overall  +11.45 %, Health/Functioning +27.04 %, Socioeconomics +0.72 %, Psych/Spiritual -10.63 %, Family -8.26 %    C.  Education/Instruction/Skills  Uses Perceived Exertion Scale and/or Dyspnea Scale and Attended 6 education classes  Home exercise given: 09/20/2011  D.  Nutrition/Weight Control/Body Composition:  Patient has gained 2.1 kg  *This section completed by Mickle Plumb, Andres Shad, RD, LDN, CDE  E.  Blood Lipids    Lab Results  Component Value Date   CHOL 188 04/26/2011     Lab Results  Component Value Date   TRIG 92 04/26/2011     Lab Results  Component Value Date   HDL 51 04/26/2011     Lab Results  Component Value Date   CHOLHDL 3.7 04/26/2011     No results found for this basename: LDLDIRECT      F.  Lifestyle Changes:  Making positive lifestyle changes  G.  Symptoms noted with exercise:  Shortness of breath and Fatigue  Report Completed By:  Ruffin Frederick   Comments:  Patient has graduated from the [employer] all personal and program goals. Patient plans to attend Maintenance and continue her home exercises. Maintained SPaO2 greater thanb 90% on 2L NCC. Vital signs stable throughout program., Thank you for the referral.

## 2011-12-06 ENCOUNTER — Encounter (HOSPITAL_COMMUNITY): Payer: BC Managed Care – PPO

## 2011-12-06 ENCOUNTER — Encounter (HOSPITAL_COMMUNITY)
Admission: RE | Admit: 2011-12-06 | Discharge: 2011-12-06 | Payer: BC Managed Care – PPO | Source: Ambulatory Visit | Attending: Pulmonary Disease | Admitting: Pulmonary Disease

## 2011-12-06 NOTE — Progress Notes (Signed)
Pulmonary Rehabilitation Program Progress Report   Orientation:  09/12/2011 Graduate Date:10/25/2011 Discharge Date:  10/25/2011  # of sessions completed: 12 out of 12   Pulmonologist: Clance Class Time:  1:30  A.  Exercise Program:  Tolerates exercise @ 2,45 METS for 45 minutes, Walk Test Results:  Pre: 835 ft and Post: 1000 ft, Improved functional capacity  19.76 %, Decreased  muscular strength  10.34 %, Improved dyspnea score 64 %, Improved education score 8.33 % and Patient plans to continue pulmonary rehab maintenance program  B.  Mental Health:  Good mental attitude and Quality of Life (QOL)  changes:  Overall  +11.45 %, Health/Functioning +27.04 %, Socioeconomics +0.72 %, Psych/Spiritual -10.63 %, Family -8.26 %    C.  Education/Instruction/Skills  Uses Perceived Exertion Scale and/or Dyspnea Scale and Attended 6 education classes  Home exercise given: 09/20/2011  D.  Nutrition/Weight Control/Body Composition:  Patient has gained 2.1 kg, Pt is knowledgeable re: healthy diet for lung disease.  *This section completed by Mickle Plumb, Andres Shad, RD, LDN, CDE  E.  Blood Lipids Lab Results  Component Value Date   CHOL 188 04/26/2011   HDL 51 04/26/2011   LDLCALC 119* 04/26/2011   TRIG 92 04/26/2011   CHOLHDL 3.7 04/26/2011   *This section completed by Mickle Plumb, Andres Shad, RD, LDN, CDE F.  Lifestyle Changes:  Making positive lifestyle changes  G.  Symptoms noted with exercise:  Shortness of breath and Fatigue  Report Completed By:  Brock Ra, EP   Comments:  Patient has graduated from the [employer] all personal and program goals. Patient plans to attend Maintenance and continue her home exercises. Maintained SPaO2 greater thanb 90% on 2L NCC. Vital signs stable throughout program., Thank you for the referral.

## 2011-12-07 ENCOUNTER — Other Ambulatory Visit (INDEPENDENT_AMBULATORY_CARE_PROVIDER_SITE_OTHER): Payer: BC Managed Care – PPO | Admitting: Internal Medicine

## 2011-12-07 DIAGNOSIS — Z0000 Encounter for general adult medical examination without abnormal findings: Secondary | ICD-10-CM

## 2011-12-07 LAB — COMPREHENSIVE METABOLIC PANEL
ALT: 37 U/L — ABNORMAL HIGH (ref 0–35)
AST: 29 U/L (ref 0–37)
Albumin: 4.7 g/dL (ref 3.5–5.2)
Alkaline Phosphatase: 67 U/L (ref 39–117)
BUN: 20 mg/dL (ref 6–23)
CO2: 24 mEq/L (ref 19–32)
Calcium: 9.9 mg/dL (ref 8.4–10.5)
Chloride: 103 mEq/L (ref 96–112)
Creat: 0.88 mg/dL (ref 0.50–1.10)
Glucose, Bld: 104 mg/dL — ABNORMAL HIGH (ref 70–99)
Potassium: 4.1 mEq/L (ref 3.5–5.3)
Sodium: 140 mEq/L (ref 135–145)
Total Bilirubin: 0.5 mg/dL (ref 0.3–1.2)
Total Protein: 7.2 g/dL (ref 6.0–8.3)

## 2011-12-07 LAB — LIPID PANEL
Cholesterol: 252 mg/dL — ABNORMAL HIGH (ref 0–200)
HDL: 58 mg/dL (ref >39)
LDL Cholesterol: 164 mg/dL — ABNORMAL HIGH (ref 0–99)
Total CHOL/HDL Ratio: 4.3 Ratio
Triglycerides: 152 mg/dL — ABNORMAL HIGH (ref <150)
VLDL: 30 mg/dL (ref 0–40)

## 2011-12-07 LAB — TSH: TSH: 1.899 u[IU]/mL (ref 0.350–4.500)

## 2011-12-08 LAB — CBC WITH DIFFERENTIAL/PLATELET
Basophils Absolute: 0 10*3/uL (ref 0.0–0.1)
Basophils Relative: 1 % (ref 0–1)
Eosinophils Absolute: 0.2 10*3/uL (ref 0.0–0.7)
Eosinophils Relative: 3 % (ref 0–5)
HCT: 46.8 % — ABNORMAL HIGH (ref 36.0–46.0)
Hemoglobin: 15.4 g/dL — ABNORMAL HIGH (ref 12.0–15.0)
Lymphocytes Relative: 21 % (ref 12–46)
Lymphs Abs: 1.3 10*3/uL (ref 0.7–4.0)
MCH: 32.6 pg (ref 26.0–34.0)
MCHC: 32.9 g/dL (ref 30.0–36.0)
MCV: 98.9 fL (ref 78.0–100.0)
Monocytes Absolute: 0.7 10*3/uL (ref 0.1–1.0)
Monocytes Relative: 10 % (ref 3–12)
Neutro Abs: 4.2 10*3/uL (ref 1.7–7.7)
Neutrophils Relative %: 66 % (ref 43–77)
Platelets: 201 10*3/uL (ref 150–400)
RBC: 4.73 MIL/uL (ref 3.87–5.11)
RDW: 14.2 % (ref 11.5–15.5)
WBC: 6.4 10*3/uL (ref 4.0–10.5)

## 2011-12-08 LAB — VITAMIN D 25 HYDROXY (VIT D DEFICIENCY, FRACTURES): Vit D, 25-Hydroxy: 40 ng/mL (ref 30–89)

## 2011-12-11 ENCOUNTER — Encounter (HOSPITAL_COMMUNITY): Payer: BC Managed Care – PPO

## 2011-12-11 ENCOUNTER — Encounter (HOSPITAL_COMMUNITY)
Admission: RE | Admit: 2011-12-11 | Discharge: 2011-12-11 | Disposition: A | Discharge status: Home / Self Care | Payer: BC Managed Care – PPO | Source: Ambulatory Visit | Attending: Pulmonary Disease | Admitting: Pulmonary Disease

## 2011-12-11 ENCOUNTER — Encounter: Payer: Self-pay | Admitting: Internal Medicine

## 2011-12-11 ENCOUNTER — Ambulatory Visit (INDEPENDENT_AMBULATORY_CARE_PROVIDER_SITE_OTHER): Payer: BC Managed Care – PPO | Admitting: Internal Medicine

## 2011-12-11 VITALS — BP 140/82 | HR 76 | Temp 98.9°F | Ht 62.25 in | Wt 152.5 lb

## 2011-12-11 DIAGNOSIS — I10 Essential (primary) hypertension: Secondary | ICD-10-CM

## 2011-12-11 DIAGNOSIS — I447 Left bundle-branch block, unspecified: Secondary | ICD-10-CM | POA: Insufficient documentation

## 2011-12-11 DIAGNOSIS — J449 Chronic obstructive pulmonary disease, unspecified: Secondary | ICD-10-CM | POA: Insufficient documentation

## 2011-12-11 DIAGNOSIS — Z8669 Personal history of other diseases of the nervous system and sense organs: Secondary | ICD-10-CM

## 2011-12-11 DIAGNOSIS — Z5189 Encounter for other specified aftercare: Secondary | ICD-10-CM | POA: Insufficient documentation

## 2011-12-11 DIAGNOSIS — E785 Hyperlipidemia, unspecified: Secondary | ICD-10-CM | POA: Insufficient documentation

## 2011-12-11 DIAGNOSIS — E042 Nontoxic multinodular goiter: Secondary | ICD-10-CM | POA: Insufficient documentation

## 2011-12-11 DIAGNOSIS — I2510 Atherosclerotic heart disease of native coronary artery without angina pectoris: Secondary | ICD-10-CM | POA: Insufficient documentation

## 2011-12-11 DIAGNOSIS — J4489 Other specified chronic obstructive pulmonary disease: Secondary | ICD-10-CM | POA: Insufficient documentation

## 2011-12-11 DIAGNOSIS — Z87440 Personal history of urinary (tract) infections: Secondary | ICD-10-CM

## 2011-12-11 DIAGNOSIS — Z0000 Encounter for general adult medical examination without abnormal findings: Secondary | ICD-10-CM

## 2011-12-11 DIAGNOSIS — Z9981 Dependence on supplemental oxygen: Secondary | ICD-10-CM

## 2011-12-11 LAB — POCT URINALYSIS DIPSTICK
Bilirubin, UA: NEGATIVE
Blood, UA: NEGATIVE
Glucose, UA: NEGATIVE
Ketones, UA: NEGATIVE
Leukocytes, UA: NEGATIVE
Nitrite, UA: NEGATIVE
Protein, UA: NEGATIVE
Spec Grav, UA: 1.01
Urobilinogen, UA: NEGATIVE
pH, UA: 6.5

## 2011-12-13 ENCOUNTER — Encounter (HOSPITAL_COMMUNITY): Payer: BC Managed Care – PPO

## 2011-12-17 ENCOUNTER — Other Ambulatory Visit: Payer: Self-pay | Admitting: Internal Medicine

## 2011-12-17 ENCOUNTER — Ambulatory Visit
Admission: RE | Admit: 2011-12-17 | Discharge: 2011-12-17 | Disposition: A | Discharge status: Home / Self Care | Payer: BC Managed Care – PPO | Source: Ambulatory Visit | Attending: Internal Medicine | Admitting: Internal Medicine

## 2011-12-17 ENCOUNTER — Ambulatory Visit (INDEPENDENT_AMBULATORY_CARE_PROVIDER_SITE_OTHER): Payer: BC Managed Care – PPO | Admitting: Internal Medicine

## 2011-12-17 VITALS — BP 140/82 | HR 76 | Temp 98.9°F | Resp 12 | Wt 152.5 lb

## 2011-12-17 DIAGNOSIS — R51 Headache: Secondary | ICD-10-CM

## 2011-12-17 MED ORDER — GADOBENATE DIMEGLUMINE 529 MG/ML IV SOLN
7.0000 mL | Freq: Once | INTRAVENOUS | Status: DC | PRN
  Administered 2011-12-17: 7 mL via INTRAVENOUS

## 2011-12-18 ENCOUNTER — Encounter: Payer: Self-pay | Admitting: Internal Medicine

## 2011-12-18 ENCOUNTER — Ambulatory Visit (INDEPENDENT_AMBULATORY_CARE_PROVIDER_SITE_OTHER): Payer: BC Managed Care – PPO | Admitting: Internal Medicine

## 2011-12-18 ENCOUNTER — Encounter (HOSPITAL_COMMUNITY): Payer: BC Managed Care – PPO

## 2011-12-18 DIAGNOSIS — M25519 Pain in unspecified shoulder: Secondary | ICD-10-CM

## 2011-12-18 DIAGNOSIS — T82898A Other specified complication of vascular prosthetic devices, implants and grafts, initial encounter: Secondary | ICD-10-CM

## 2011-12-18 MED ORDER — HYDROCODONE-ACETAMINOPHEN 5-500 MG PO TABS: 1.0000 | ORAL_TABLET | Freq: Four times a day (QID) | ORAL | Status: AC | PRN

## 2011-12-18 NOTE — Patient Instructions (Signed)
Take Fiorinal  sparingly for pain. Call us with progress report tomorrow.

## 2011-12-18 NOTE — Progress Notes (Signed)
  Subjective:    Patient ID: [name], female    DOB: 1951-06-17, 61 y.o.   MRN: [id number]  HPI 61 year old white female with long-standing history of migraine headaches in today for an acute visit. Had onset of left parieto-occipital headache yesterday afternoon. Headache persisted and in late evening around midnight she noticed her left eye was drooping. She became alarmed and called her son-in-law who is a pediatric neurologist. He advised her to get attention immediately. She had no difficulty with speech. No limb weakness. No difficulty with vision. She had nausea but no vomiting. She spent a restless night and did not get much sleep. When she awakened this morning the left eye droopiness was gone. Still has headache. However in the meantime she has taken some Fiorinal gotten some partial relief of the pain. She has almost weekly headaches. Doesn't want to be on prophylaxis. Years ago she was on Sansert for prophylaxis. She has a history of carcinoma of the kidney and severe COPD requiring home oxygen at 3 L per minute. She also has hypertension and hyperlipidemia. A few months ago she had a right frozen shoulder for which she was treated by Dr. Teressa Senter with physical therapy. She attends pulmonary rehabilitation exercise classes at [facility].     Review of Systems     Objective:   Physical Exam PERRLA ; funduscopic exam: The disc are sharp and flat bilaterally and vasculature appear to be normal. No facial weakness or asymmetry. Grips are equal. Deep tendon reflexes 2+ and symmetrical in upper and lower extremities. Muscle strength is normal in upper and lower extremities. Gait is normal. Cerebellar finger to nose testing normal. Extraocular movements are full. Normal mentation. Alert and oriented x3. No rash left parietal scalp area.        Assessment & Plan:  Suspect this represents a complicated migraine. Need to rule out intracranial pathology. Plan patient will be scheduled for an  urgent MRI of the brain with contrast today. Consider giving her steroids for this protracted headache however would like for her to continue take taking Fiorinal sparingly for now.  Addendum: MRI of the brain shows no intracranial pathology. Patient informed by telephone. Patient is to call us with progress report tomorrow.

## 2011-12-19 ENCOUNTER — Telehealth: Payer: Self-pay | Admitting: Internal Medicine

## 2011-12-19 NOTE — Telephone Encounter (Signed)
Pt called to say she was better. Headache has improved greatly after sleep and Vicodin. Still sore in right shoulder area but swelling in right supraclavicular area has improved although not totally disappeared. Pt will call if this does not resolve in 48 hours.

## 2011-12-20 ENCOUNTER — Encounter (HOSPITAL_COMMUNITY): Payer: BC Managed Care – PPO

## 2011-12-23 NOTE — Progress Notes (Signed)
  Subjective:    Patient ID: [name], female    DOB: 29-Dec-1950, 61 y.o.   MRN: [id number]  HPI 61 year old white female who presented with severe headache with left eye drooping yesterday. We were able to get an MRI of the brain done with contrast and grams per and itching. Patient reported that there was difficulty obtaining IV access for the contrast. She has several sites where IV access was attempted on both arms. Patient awakened today and noted swelling above right clavicle. Also had pain in right shoulder and right scapular area. History of frozen shoulder treated by Dr. Teressa Senter. Patient worried about swelling above her clavicle. No shortness of breath. Currently on home oxygen at 3 L per minute.    Review of Systems     Objective:   Physical Exam  there is swelling above right clavicle. There is no crepitus in that area. Multiple IV sites both arms. No evidence of cellulitis at the sites. Right radial pulse is normal. Good range of motion in right upper extremity. Tender in right parascapular area extending into right neck.        Assessment & Plan:  Right supraclavicular swelling-etiology unclear. Suspect there may have been some IV extravasation. Do not suspect pulmonary etiology. Patient was an MRI for some period of time perhaps 40 minutes. Seems to have musculoskeletal pain right shoulder and scapular area. History of right frozen shoulder.  COPD requires home oxygen  Recent complicated migraine headache. Feeling better. MRI of brain was negative.  History of right frozen shoulder  Musculoskeletal pain right scapula  Plan: I will speak with the radiologist at [facility]. We'll try to learn how much IV contrast was given. Patient is to take Vicodin 5/500 one by mouth every 6 hours when necessary pain. She is to call tomorrow with progress report.

## 2011-12-23 NOTE — Patient Instructions (Addendum)
Apply ice to neck and shoulder 20 minutes once or twice daily. Call tomorrow with progress report. I will ask radiologist to investigate amount of IV contrast given during MRI and to give you a call. Take Vicodin one every 6 hours as needed for pain.

## 2011-12-25 ENCOUNTER — Encounter (HOSPITAL_COMMUNITY): Payer: BC Managed Care – PPO

## 2011-12-27 ENCOUNTER — Encounter (HOSPITAL_COMMUNITY): Payer: BC Managed Care – PPO

## 2011-12-27 ENCOUNTER — Encounter (HOSPITAL_COMMUNITY)
Admission: RE | Admit: 2011-12-27 | Discharge: 2011-12-27 | Disposition: A | Discharge status: Home / Self Care | Payer: BC Managed Care – PPO | Source: Ambulatory Visit | Attending: Pulmonary Disease | Admitting: Pulmonary Disease

## 2012-01-01 ENCOUNTER — Encounter (HOSPITAL_COMMUNITY): Payer: BC Managed Care – PPO

## 2012-01-03 ENCOUNTER — Encounter (HOSPITAL_COMMUNITY)
Admission: RE | Admit: 2012-01-03 | Discharge: 2012-01-03 | Disposition: A | Discharge status: Home / Self Care | Payer: BC Managed Care – PPO | Source: Ambulatory Visit | Attending: Pulmonary Disease | Admitting: Pulmonary Disease
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
## 2012-01-08 ENCOUNTER — Encounter (HOSPITAL_COMMUNITY)
Admission: RE | Admit: 2012-01-08 | Discharge: 2012-01-08 | Disposition: A | Discharge status: Home / Self Care | Payer: BC Managed Care – PPO | Source: Ambulatory Visit | Attending: Pulmonary Disease | Admitting: Pulmonary Disease

## 2012-01-08 DIAGNOSIS — J449 Chronic obstructive pulmonary disease, unspecified: Secondary | ICD-10-CM | POA: Insufficient documentation

## 2012-01-08 DIAGNOSIS — I447 Left bundle-branch block, unspecified: Secondary | ICD-10-CM | POA: Insufficient documentation

## 2012-01-08 DIAGNOSIS — Z5189 Encounter for other specified aftercare: Secondary | ICD-10-CM | POA: Insufficient documentation

## 2012-01-08 DIAGNOSIS — J4489 Other specified chronic obstructive pulmonary disease: Secondary | ICD-10-CM | POA: Insufficient documentation

## 2012-01-08 DIAGNOSIS — E785 Hyperlipidemia, unspecified: Secondary | ICD-10-CM | POA: Insufficient documentation

## 2012-01-08 DIAGNOSIS — I10 Essential (primary) hypertension: Secondary | ICD-10-CM | POA: Insufficient documentation

## 2012-01-08 DIAGNOSIS — E042 Nontoxic multinodular goiter: Secondary | ICD-10-CM | POA: Insufficient documentation

## 2012-01-08 DIAGNOSIS — I2510 Atherosclerotic heart disease of native coronary artery without angina pectoris: Secondary | ICD-10-CM | POA: Insufficient documentation

## 2012-01-10 ENCOUNTER — Encounter: Payer: Self-pay | Admitting: Pulmonary Disease

## 2012-01-10 ENCOUNTER — Encounter (HOSPITAL_COMMUNITY)
Admission: RE | Admit: 2012-01-10 | Discharge: 2012-01-10 | Disposition: A | Discharge status: Home / Self Care | Payer: BC Managed Care – PPO | Source: Ambulatory Visit | Attending: Pulmonary Disease | Admitting: Pulmonary Disease

## 2012-01-10 ENCOUNTER — Ambulatory Visit (INDEPENDENT_AMBULATORY_CARE_PROVIDER_SITE_OTHER): Payer: BC Managed Care – PPO | Admitting: Pulmonary Disease

## 2012-01-10 VITALS — BP 162/92 | HR 78 | Temp 98.2°F | Ht 63.0 in | Wt 157.0 lb

## 2012-01-10 DIAGNOSIS — J449 Chronic obstructive pulmonary disease, unspecified: Secondary | ICD-10-CM

## 2012-01-10 MED ORDER — AEROCHAMBER Z-STAT PLUS MISC: Status: AC

## 2012-01-10 NOTE — Assessment & Plan Note (Signed)
The patient is doing well from a COPD standpoint, and is participating in pulmonary rehabilitation.  I have asked her to continue on her current medications, as well as her exercise program.  If she is doing well, she is to followup with me in 6 months.

## 2012-01-10 NOTE — Progress Notes (Signed)
  Subjective:    Patient ID: [name], female    DOB: [birth year], 61 y.o.   MRN: [id number]  HPI The patient comes in today for followup of her known COPD with chronic respiratory failure.  She has been doing very well since the last visit, and has had no recent chest infections or acute exacerbations.  She is still participating in pulmonary rehabilitation and feels that she is doing well in the program.  She is staying compliant on her inhalers and oxygen regimen.  She denies any chest congestion, cough, or purulent mucus.   Review of Systems  Constitutional: Positive for fever. Negative for unexpected weight change.  HENT: Positive for nosebleeds, congestion, rhinorrhea, sneezing, dental problem and sinus pressure. Negative for ear pain, sore throat, trouble swallowing and postnasal drip.   Eyes: Positive for redness and itching.  Respiratory: Positive for cough, chest tightness, shortness of breath and wheezing.   Cardiovascular: Positive for leg swelling. Negative for palpitations.  Gastrointestinal: Negative for nausea, vomiting and diarrhea.  Genitourinary: Negative for dysuria.  Musculoskeletal: Negative for joint swelling.  Skin: Negative for rash.  Neurological: Positive for headaches.  Hematological: Bruises/bleeds easily.  Psychiatric/Behavioral: Positive for dysphoric mood. The patient is nervous/anxious.        Objective:   Physical Exam Overweight female in no acute distress Nose without purulent discharge noted Chest with decreased breath sounds throughout, no wheezes or rhonchi Cardiac exam with regular rate and rhythm Lower extremities with minimal ankle edema, no cyanosis Alert and oriented, moves all 4 extremities.       Assessment & Plan:

## 2012-01-10 NOTE — Patient Instructions (Signed)
No change in pulmonary medications Stay in pulmonary rehab, work on weight loss followup with me in 6mos.

## 2012-01-10 NOTE — Progress Notes (Signed)
Addended by: Julaine Hua on: 01/10/2012 01:53 PM   Modules accepted: Orders

## 2012-01-15 ENCOUNTER — Encounter (HOSPITAL_COMMUNITY)
Admission: RE | Admit: 2012-01-15 | Discharge: 2012-01-15 | Disposition: A | Discharge status: Home / Self Care | Payer: BC Managed Care – PPO | Source: Ambulatory Visit | Attending: Pulmonary Disease | Admitting: Pulmonary Disease

## 2012-01-17 ENCOUNTER — Encounter (HOSPITAL_COMMUNITY)
Admission: RE | Admit: 2012-01-17 | Discharge: 2012-01-17 | Disposition: A | Discharge status: Home / Self Care | Payer: BC Managed Care – PPO | Source: Ambulatory Visit | Attending: Pulmonary Disease | Admitting: Pulmonary Disease

## 2012-01-22 ENCOUNTER — Encounter (HOSPITAL_COMMUNITY)
Admission: RE | Admit: 2012-01-22 | Discharge: 2012-01-22 | Disposition: A | Discharge status: Home / Self Care | Payer: BC Managed Care – PPO | Source: Ambulatory Visit | Attending: Pulmonary Disease | Admitting: Pulmonary Disease

## 2012-01-24 ENCOUNTER — Encounter (HOSPITAL_COMMUNITY)
Admission: RE | Admit: 2012-01-24 | Discharge: 2012-01-24 | Disposition: A | Discharge status: Home / Self Care | Payer: BC Managed Care – PPO | Source: Ambulatory Visit | Attending: Pulmonary Disease | Admitting: Pulmonary Disease

## 2012-01-28 ENCOUNTER — Other Ambulatory Visit: Payer: Self-pay | Admitting: Internal Medicine

## 2012-01-29 ENCOUNTER — Encounter (HOSPITAL_COMMUNITY)
Admission: RE | Admit: 2012-01-29 | Discharge: 2012-01-29 | Disposition: A | Discharge status: Home / Self Care | Payer: BC Managed Care – PPO | Source: Ambulatory Visit | Attending: Pulmonary Disease | Admitting: Pulmonary Disease

## 2012-01-31 ENCOUNTER — Encounter (HOSPITAL_COMMUNITY)
Admission: RE | Admit: 2012-01-31 | Discharge: 2012-01-31 | Disposition: A | Discharge status: Home / Self Care | Payer: BC Managed Care – PPO | Source: Ambulatory Visit | Attending: Pulmonary Disease | Admitting: Pulmonary Disease

## 2012-02-01 ENCOUNTER — Encounter: Payer: Self-pay | Admitting: Internal Medicine

## 2012-02-01 NOTE — Patient Instructions (Signed)
Continue same medications and return in 6 months. Continue pulmonary rehabilitation. Continue home oxygen.

## 2012-02-05 ENCOUNTER — Encounter (HOSPITAL_COMMUNITY)
Admission: RE | Admit: 2012-02-05 | Discharge: 2012-02-05 | Disposition: A | Discharge status: Home / Self Care | Payer: BC Managed Care – PPO | Source: Ambulatory Visit | Attending: Pulmonary Disease | Admitting: Pulmonary Disease

## 2012-02-05 DIAGNOSIS — E042 Nontoxic multinodular goiter: Secondary | ICD-10-CM | POA: Insufficient documentation

## 2012-02-05 DIAGNOSIS — I447 Left bundle-branch block, unspecified: Secondary | ICD-10-CM | POA: Insufficient documentation

## 2012-02-05 DIAGNOSIS — I2510 Atherosclerotic heart disease of native coronary artery without angina pectoris: Secondary | ICD-10-CM | POA: Insufficient documentation

## 2012-02-05 DIAGNOSIS — Z5189 Encounter for other specified aftercare: Secondary | ICD-10-CM | POA: Insufficient documentation

## 2012-02-05 DIAGNOSIS — J449 Chronic obstructive pulmonary disease, unspecified: Secondary | ICD-10-CM | POA: Insufficient documentation

## 2012-02-05 DIAGNOSIS — J4489 Other specified chronic obstructive pulmonary disease: Secondary | ICD-10-CM | POA: Insufficient documentation

## 2012-02-05 DIAGNOSIS — I10 Essential (primary) hypertension: Secondary | ICD-10-CM | POA: Insufficient documentation

## 2012-02-05 DIAGNOSIS — E785 Hyperlipidemia, unspecified: Secondary | ICD-10-CM | POA: Insufficient documentation

## 2012-02-07 ENCOUNTER — Encounter (HOSPITAL_COMMUNITY)
Admission: RE | Admit: 2012-02-07 | Discharge: 2012-02-07 | Disposition: A | Discharge status: Home / Self Care | Payer: BC Managed Care – PPO | Source: Ambulatory Visit | Attending: Pulmonary Disease | Admitting: Pulmonary Disease

## 2012-02-11 ENCOUNTER — Telehealth: Payer: Self-pay | Admitting: Internal Medicine

## 2012-02-11 NOTE — Telephone Encounter (Signed)
Prescription corrected at pharmacy per [place].

## 2012-02-12 ENCOUNTER — Encounter (HOSPITAL_COMMUNITY)
Admission: RE | Admit: 2012-02-12 | Discharge: 2012-02-12 | Disposition: A | Discharge status: Home / Self Care | Payer: BC Managed Care – PPO | Source: Ambulatory Visit | Attending: Pulmonary Disease | Admitting: Pulmonary Disease

## 2012-02-14 ENCOUNTER — Encounter (HOSPITAL_COMMUNITY)
Admission: RE | Admit: 2012-02-14 | Discharge: 2012-02-14 | Disposition: A | Discharge status: Home / Self Care | Payer: BC Managed Care – PPO | Source: Ambulatory Visit | Attending: Pulmonary Disease | Admitting: Pulmonary Disease

## 2012-02-19 ENCOUNTER — Encounter (HOSPITAL_COMMUNITY)
Admission: RE | Admit: 2012-02-19 | Discharge: 2012-02-19 | Disposition: A | Discharge status: Home / Self Care | Payer: BC Managed Care – PPO | Source: Ambulatory Visit | Attending: Pulmonary Disease | Admitting: Pulmonary Disease

## 2012-02-19 NOTE — Progress Notes (Signed)
Patient tried the elliptical today in pulmonary rehab. Patient did it for 3 minutes and stopped and took a rest break,  then went again for 3 mins. Patients vital signs were stable. Patient plans to continue using the elliptical depending on how she feels on that particular day with her breathing. Will continue to encourage and support.    Ruffin Frederick, MS, NASM, CES

## 2012-02-21 ENCOUNTER — Encounter (HOSPITAL_COMMUNITY)
Admission: RE | Admit: 2012-02-21 | Discharge: 2012-02-21 | Disposition: A | Discharge status: Home / Self Care | Payer: BC Managed Care – PPO | Source: Ambulatory Visit | Attending: Pulmonary Disease | Admitting: Pulmonary Disease

## 2012-02-26 ENCOUNTER — Encounter (HOSPITAL_COMMUNITY): Payer: BC Managed Care – PPO

## 2012-02-26 ENCOUNTER — Ambulatory Visit (INDEPENDENT_AMBULATORY_CARE_PROVIDER_SITE_OTHER): Payer: BC Managed Care – PPO | Admitting: Internal Medicine

## 2012-02-26 ENCOUNTER — Encounter: Payer: Self-pay | Admitting: Internal Medicine

## 2012-02-26 VITALS — BP 126/68 | HR 78 | Temp 99.0°F | Resp 18 | Wt 158.0 lb

## 2012-02-26 DIAGNOSIS — R5381 Other malaise: Secondary | ICD-10-CM

## 2012-02-26 DIAGNOSIS — R5383 Other fatigue: Secondary | ICD-10-CM

## 2012-02-26 DIAGNOSIS — M549 Dorsalgia, unspecified: Secondary | ICD-10-CM

## 2012-02-26 DIAGNOSIS — N390 Urinary tract infection, site not specified: Secondary | ICD-10-CM

## 2012-02-26 DIAGNOSIS — J449 Chronic obstructive pulmonary disease, unspecified: Secondary | ICD-10-CM

## 2012-02-26 LAB — POCT URINALYSIS DIPSTICK
Bilirubin, UA: NEGATIVE
Blood, UA: NEGATIVE
Glucose, UA: NEGATIVE
Ketones, UA: NEGATIVE
Leukocytes, UA: NEGATIVE
Nitrite, UA: NEGATIVE
Protein, UA: NEGATIVE
Spec Grav, UA: 1.015
Urobilinogen, UA: 0.2
pH, UA: 6.5

## 2012-02-27 MED ORDER — AMOXICILLIN-POT CLAVULANATE 500-125 MG PO TABS: 1.0000 | ORAL_TABLET | Freq: Three times a day (TID) | ORAL | Status: AC

## 2012-02-27 MED ORDER — HYDROCODONE-ACETAMINOPHEN 5-500 MG PO TABS: 1.0000 | ORAL_TABLET | Freq: Four times a day (QID) | ORAL | Status: AC | PRN

## 2012-02-28 ENCOUNTER — Encounter (HOSPITAL_COMMUNITY)
Admission: RE | Admit: 2012-02-28 | Discharge: 2012-02-28 | Disposition: A | Discharge status: Home / Self Care | Payer: BC Managed Care – PPO | Source: Ambulatory Visit | Attending: Pulmonary Disease | Admitting: Pulmonary Disease

## 2012-03-03 ENCOUNTER — Encounter: Payer: Self-pay | Admitting: Internal Medicine

## 2012-03-03 NOTE — Patient Instructions (Signed)
Stop exercising on elliptical machine for a few days. Use heating pad or musculoskeletal back pain. Take Vicodin 5/500 every 8 hour sparingly for pain

## 2012-03-03 NOTE — Progress Notes (Signed)
  Subjective:    Patient ID: [name], female    DOB: 1951-06-28, 61 y.o.   MRN: [id number]  HPI patient called complaining of a two-day history of back pain. It is bilateral in nature. Doesn't recall any heavy lifting or physical activity other than getting on an elliptical machine and pulmonary rehabilitation. She wears home oxygen and is on chronic oxygen therapy. History of COPD. History of recurrent urinary tract infections. History of kidney cancer. Patient is concerned about possible recurrent urinary tract infection. She and her husband leave late May for trip to [place].    Review of Systems     Objective:   Physical Exam chest is clear to auscultation. She has bilateral para thoracic tenderness that follows along the lower ribs bilaterally.        Assessment & Plan:  Chest wall pain-musculoskeletal pain  Plan: Prescription for Vicodin 5/500 #60 one by mouth Q8 hours when necessary pain.

## 2012-03-04 ENCOUNTER — Encounter (HOSPITAL_COMMUNITY)
Admission: RE | Admit: 2012-03-04 | Discharge: 2012-03-04 | Disposition: A | Discharge status: Home / Self Care | Payer: BC Managed Care – PPO | Source: Ambulatory Visit | Attending: Pulmonary Disease | Admitting: Pulmonary Disease

## 2012-03-06 ENCOUNTER — Encounter (HOSPITAL_COMMUNITY)
Admission: RE | Admit: 2012-03-06 | Discharge: 2012-03-06 | Disposition: A | Discharge status: Home / Self Care | Payer: BC Managed Care – PPO | Source: Ambulatory Visit | Attending: Pulmonary Disease | Admitting: Pulmonary Disease

## 2012-03-06 DIAGNOSIS — E042 Nontoxic multinodular goiter: Secondary | ICD-10-CM | POA: Insufficient documentation

## 2012-03-06 DIAGNOSIS — I447 Left bundle-branch block, unspecified: Secondary | ICD-10-CM | POA: Insufficient documentation

## 2012-03-06 DIAGNOSIS — I10 Essential (primary) hypertension: Secondary | ICD-10-CM | POA: Insufficient documentation

## 2012-03-06 DIAGNOSIS — J449 Chronic obstructive pulmonary disease, unspecified: Secondary | ICD-10-CM | POA: Insufficient documentation

## 2012-03-06 DIAGNOSIS — I2510 Atherosclerotic heart disease of native coronary artery without angina pectoris: Secondary | ICD-10-CM | POA: Insufficient documentation

## 2012-03-06 DIAGNOSIS — E785 Hyperlipidemia, unspecified: Secondary | ICD-10-CM | POA: Insufficient documentation

## 2012-03-06 DIAGNOSIS — Z5189 Encounter for other specified aftercare: Secondary | ICD-10-CM | POA: Insufficient documentation

## 2012-03-06 DIAGNOSIS — J4489 Other specified chronic obstructive pulmonary disease: Secondary | ICD-10-CM | POA: Insufficient documentation

## 2012-03-11 ENCOUNTER — Encounter (HOSPITAL_COMMUNITY)
Admission: RE | Admit: 2012-03-11 | Discharge: 2012-03-11 | Disposition: A | Discharge status: Home / Self Care | Payer: BC Managed Care – PPO | Source: Ambulatory Visit | Attending: Pulmonary Disease | Admitting: Pulmonary Disease

## 2012-03-13 ENCOUNTER — Encounter (HOSPITAL_COMMUNITY)
Admission: RE | Admit: 2012-03-13 | Discharge: 2012-03-13 | Disposition: A | Discharge status: Home / Self Care | Payer: BC Managed Care – PPO | Source: Ambulatory Visit | Attending: Pulmonary Disease | Admitting: Pulmonary Disease

## 2012-03-18 ENCOUNTER — Encounter (HOSPITAL_COMMUNITY)
Admission: RE | Admit: 2012-03-18 | Discharge: 2012-03-18 | Disposition: A | Discharge status: Home / Self Care | Payer: BC Managed Care – PPO | Source: Ambulatory Visit | Attending: Pulmonary Disease | Admitting: Pulmonary Disease

## 2012-03-19 ENCOUNTER — Other Ambulatory Visit: Payer: Self-pay | Admitting: Internal Medicine

## 2012-03-20 ENCOUNTER — Encounter (HOSPITAL_COMMUNITY)
Admission: RE | Admit: 2012-03-20 | Discharge: 2012-03-20 | Disposition: A | Discharge status: Home / Self Care | Payer: BC Managed Care – PPO | Source: Ambulatory Visit | Attending: Pulmonary Disease | Admitting: Pulmonary Disease

## 2012-03-24 ENCOUNTER — Encounter: Payer: Self-pay | Admitting: Internal Medicine

## 2012-03-24 ENCOUNTER — Ambulatory Visit (INDEPENDENT_AMBULATORY_CARE_PROVIDER_SITE_OTHER): Payer: BC Managed Care – PPO | Admitting: Internal Medicine

## 2012-03-24 VITALS — BP 130/82 | Temp 98.9°F | Wt 155.0 lb

## 2012-03-24 DIAGNOSIS — I10 Essential (primary) hypertension: Secondary | ICD-10-CM

## 2012-03-24 DIAGNOSIS — J449 Chronic obstructive pulmonary disease, unspecified: Secondary | ICD-10-CM

## 2012-03-24 DIAGNOSIS — Z85528 Personal history of other malignant neoplasm of kidney: Secondary | ICD-10-CM

## 2012-03-24 DIAGNOSIS — I2510 Atherosclerotic heart disease of native coronary artery without angina pectoris: Secondary | ICD-10-CM

## 2012-03-24 DIAGNOSIS — Z87440 Personal history of urinary (tract) infections: Secondary | ICD-10-CM

## 2012-03-24 NOTE — Progress Notes (Signed)
  Subjective:    Patient ID: [name], female    DOB: [birth year], 61 y.o.   MRN: [id number]  HPI Pleasant 61 year old white female with multiple medical problems including COPD with chronic oxygen therapy at 3 L per minute, hypertension, coronary artery disease, migraine headaches, allergic rhinitis, history of renal cancer, history of recurrent urinary tract infections in today to have form completed to go on a cruise to the [place]. Will be traveling with an oxygen concentrator with O2 at 3 L per minute. Needs to have wheelchair transportation to and from the ship.    Review of Systems     Objective:   Physical Exam chest clear to auscultation; cardiac exam regular rate and rhythm        Assessment & Plan:  COPD with oxygen at 3 L per minute  Hypertension  Coronary artery disease  History of migraine headaches  History of renal cancer  History of recurrent urinary tract infections  Plan: Form completed for cruise line asking for diagnoses, medications, recent hospitalizations. Patient has not been hospitalized in the past year. Have given her a set of her medical records plus an EKG done in 2012 to take with her on this cruise. She will be gone approximately 12 days departing on May 23.

## 2012-03-24 NOTE — Patient Instructions (Addendum)
Please take all of your medications with you on your cruise, oxygen concentrator and set up your medical records.

## 2012-03-25 ENCOUNTER — Encounter (HOSPITAL_COMMUNITY): Payer: BC Managed Care – PPO

## 2012-03-25 ENCOUNTER — Other Ambulatory Visit: Payer: Self-pay | Admitting: Internal Medicine

## 2012-03-27 ENCOUNTER — Encounter (HOSPITAL_COMMUNITY): Payer: BC Managed Care – PPO

## 2012-04-01 ENCOUNTER — Encounter (HOSPITAL_COMMUNITY): Payer: BC Managed Care – PPO

## 2012-04-03 ENCOUNTER — Encounter (HOSPITAL_COMMUNITY): Payer: BC Managed Care – PPO

## 2012-04-08 ENCOUNTER — Encounter (HOSPITAL_COMMUNITY): Payer: BC Managed Care – PPO

## 2012-04-08 DIAGNOSIS — I2510 Atherosclerotic heart disease of native coronary artery without angina pectoris: Secondary | ICD-10-CM | POA: Insufficient documentation

## 2012-04-08 DIAGNOSIS — Z5189 Encounter for other specified aftercare: Secondary | ICD-10-CM | POA: Insufficient documentation

## 2012-04-08 DIAGNOSIS — I10 Essential (primary) hypertension: Secondary | ICD-10-CM | POA: Insufficient documentation

## 2012-04-08 DIAGNOSIS — J4489 Other specified chronic obstructive pulmonary disease: Secondary | ICD-10-CM | POA: Insufficient documentation

## 2012-04-08 DIAGNOSIS — E042 Nontoxic multinodular goiter: Secondary | ICD-10-CM | POA: Insufficient documentation

## 2012-04-08 DIAGNOSIS — I447 Left bundle-branch block, unspecified: Secondary | ICD-10-CM | POA: Insufficient documentation

## 2012-04-08 DIAGNOSIS — E785 Hyperlipidemia, unspecified: Secondary | ICD-10-CM | POA: Insufficient documentation

## 2012-04-08 DIAGNOSIS — J449 Chronic obstructive pulmonary disease, unspecified: Secondary | ICD-10-CM | POA: Insufficient documentation

## 2012-04-10 ENCOUNTER — Encounter (HOSPITAL_COMMUNITY): Payer: BC Managed Care – PPO

## 2012-04-15 ENCOUNTER — Encounter (HOSPITAL_COMMUNITY): Payer: BC Managed Care – PPO

## 2012-04-17 ENCOUNTER — Encounter (HOSPITAL_COMMUNITY)
Admission: RE | Admit: 2012-04-17 | Discharge: 2012-04-17 | Disposition: A | Discharge status: Home / Self Care | Payer: BC Managed Care – PPO | Source: Ambulatory Visit | Attending: Pulmonary Disease | Admitting: Pulmonary Disease

## 2012-04-22 ENCOUNTER — Encounter (HOSPITAL_COMMUNITY)
Admission: RE | Admit: 2012-04-22 | Discharge: 2012-04-22 | Disposition: A | Discharge status: Home / Self Care | Payer: BC Managed Care – PPO | Source: Ambulatory Visit | Attending: Pulmonary Disease | Admitting: Pulmonary Disease

## 2012-04-23 ENCOUNTER — Ambulatory Visit (INDEPENDENT_AMBULATORY_CARE_PROVIDER_SITE_OTHER): Payer: BC Managed Care – PPO | Admitting: Cardiology

## 2012-04-23 ENCOUNTER — Encounter: Payer: Self-pay | Admitting: Cardiology

## 2012-04-23 VITALS — BP 124/86 | HR 71 | Ht 63.0 in | Wt 155.0 lb

## 2012-04-23 DIAGNOSIS — I2510 Atherosclerotic heart disease of native coronary artery without angina pectoris: Secondary | ICD-10-CM

## 2012-04-23 DIAGNOSIS — E785 Hyperlipidemia, unspecified: Secondary | ICD-10-CM

## 2012-04-23 DIAGNOSIS — I447 Left bundle-branch block, unspecified: Secondary | ICD-10-CM

## 2012-04-23 DIAGNOSIS — I10 Essential (primary) hypertension: Secondary | ICD-10-CM

## 2012-04-23 NOTE — Patient Instructions (Signed)
Your physician wants you to follow-up in: February 2014 with Dr [name].  You will receive a reminder letter in the mail two months in advance. If you don't receive a letter, please call our office to schedule the follow-up appointment.  Your physician recommends that you continue on your current medications as directed. Please refer to the Current Medication list given to you today.

## 2012-04-24 ENCOUNTER — Encounter (HOSPITAL_COMMUNITY)
Admission: RE | Admit: 2012-04-24 | Discharge: 2012-04-24 | Disposition: A | Discharge status: Home / Self Care | Payer: BC Managed Care – PPO | Source: Ambulatory Visit | Attending: Pulmonary Disease | Admitting: Pulmonary Disease

## 2012-04-29 ENCOUNTER — Encounter (HOSPITAL_COMMUNITY): Payer: BC Managed Care – PPO

## 2012-04-30 NOTE — Progress Notes (Signed)
HPI:  Patient is in for followup visit. She remains enthusiastic as ever. She lives with her illness as well as anyone that I have seen. She remains on home oxygen. She has not had frequent or progressive chest pain.  Current Outpatient Prescriptions  Medication Sig Dispense Refill  . albuterol (PROAIR HFA) 108 (90 BASE) MCG/ACT inhaler Inhale 2 puffs into the lungs every 6 (six) hours as needed.        Marland Kitchen aspirin 325 MG tablet Take 325 mg by mouth daily.        Marland Kitchen atenolol (TENORMIN) 25 MG tablet TAKE ONE TABLET BY MOUTH EVERY DAY  30 tablet  5  . AZO-CRANBERRY PO Take 2 tablets by mouth 2 (two) times daily.       Marland Kitchen Bioflavonoid Products (ESTER-C) 1000-50 MG TABS Take 1 tablet by mouth daily.        . butalbital-aspirin-caffeine (FIORINAL) 50-325-40 MG per capsule TAKE ONE TO TWO CAPSULES BY MOUTH EVERY 6 HOURS AS NEEDED FOR HEADACHE. DO NOT EXCEED 6 CAPSULES IN 24 HOURS  60 capsule  2  . cetirizine (ZYRTEC) 10 MG tablet Take 10 mg by mouth daily.       . chlorpheniramine-HYDROcodone (TUSSIONEX) 10-8 MG/5ML LQCR       . Coenzyme Q10 (COQ10) 100 MG CAPS Take 1 tablet by mouth daily.        . DULERA 100-5 MCG/ACT AERO INHALE TWO PUFFS BY MOUTH TWICE DAILY  13 g  11  . estradiol (VAGIFEM) 25 MCG vaginal tablet Insert one vaginally every 3 days       . fish oil-omega-3 fatty acids 1000 MG capsule Take 1 capsule by mouth daily.        . FOSFOMYCIN TROMETHAMINE PO 1 dose ( 3 grams)  By mouth every 3 days      . GuaiFENesin (MUCINEX PO) Take by mouth as needed.        . hydrochlorothiazide 25 MG tablet Take 50 mg by mouth daily.       Marland Kitchen levalbuterol (XOPENEX) 0.63 MG/3ML nebulizer solution Take 1 ampule by nebulization every 6 (six) hours as needed. (unsure of dosage)       . losartan (COZAAR) 50 MG tablet 25 mg daily.       . methenamine (HIPREX) 1 G tablet Take 1 g by mouth 2 (two) times daily with a meal. As needed      . Multiple Vitamin (MULTIVITAMIN PO) Take 1 tablet by mouth daily.        .  nitroGLYCERIN (NITROSTAT) 0.4 MG SL tablet Place 0.4 mg under the tongue every 5 (five) minutes as needed.        . pantoprazole (PROTONIX) 40 MG tablet Take 40 mg by mouth 2 (two) times daily.        . rosuvastatin (CRESTOR) 20 MG tablet Take 20 mg by mouth daily.        Marland Kitchen saccharomyces boulardii (FLORASTOR) 250 MG capsule Take 250 mg by mouth 2 (two) times daily.        Marland Kitchen Spacer/Aero-Holding Chambers (AEROCHAMBER Z-STAT PLUS) inhaler Use as instructed  1 each  0  . tiotropium (SPIRIVA) 18 MCG inhalation capsule Place 18 mcg into inhaler and inhale daily.        Marland Kitchen triamcinolone (NASACORT) 55 MCG/ACT nasal inhaler 1 spray by Nasal route daily.       . valACYclovir (VALTREX) 500 MG tablet Take 500 mg by mouth daily.        Marland Kitchen  Zinc 100 MG TABS Take 1 tablet by mouth daily.        Marland Kitchen DISCONTD: zolpidem (AMBIEN) 10 MG tablet Take 5 mg by mouth at bedtime as needed.          Allergies  Allergen Reactions  . Nitrofurantoin Monohyd Macro Shortness Of Breath  . Thorazine (Chlorpromazine Hcl)     Past Medical History  Diagnosis Date  . Renal cell cancer     (Lt) partial nephrectomy dx 11/06  . Emphysema   . Hypertension   . Chest pain   . Dyspnea   . CAD (coronary artery disease)   . Hyperlipidemia   . Headache   . Goiter   . COPD (chronic obstructive pulmonary disease)   . Asthma   . Allergic rhinitis     Past Surgical History  Procedure Date  . Cesarean section     x 3  . Nephrectomy     partial  . Uretheral re-implantation     Family History  Problem Relation Age of Onset  . Hypothyroidism Mother   . Heart disease Mother   . Asthma Mother   . Hypertension Mother   . Heart disease Father   . Heart attack Father   . Hypertension Father     History   Social History  . Marital Status: Married    Spouse Name: N/A    Number of Children: N/A  . Years of Education: N/A   Occupational History  . retired    Social History Main Topics  . Smoking status: Former Smoker  -- 2.0 packs/day for 35 years    Types: Cigarettes    Quit date: 11/05/2000  . Smokeless tobacco: Never Used  . Alcohol Use: Yes     occ glass of red wine with steak  . Drug Use: No  . Sexually Active: Yes    Birth Control/ Protection: Post-menopausal   Other Topics Concern  . Not on file   Social History Narrative  . No narrative on file    ROS: Please see the HPI.  All other systems reviewed and negative.  PHYSICAL EXAM:  BP 124/86  Pulse 71  Ht 5\' 3"  (1.6 m)  Wt 155 lb (70.308 kg)  BMI 27.46 kg/m2  General: Well developed, well nourished, in no acute distress.  O2 in place.   Head:  Normocephalic and atraumatic. Neck: no JVD Lungs: Decrease in BS bilaterally.   Heart: Normal S1 and paradoxical S2.  No murmur.    Abdomen:  Normal bowel sounds; soft; non tender; no organomegaly Pulses: Pulses normal in all 4 extremities. Extremities: No clubbing or cyanosis. No edema. Neurologic: Alert and oriented x 3.  EKG:  NSR.  LBBB.  CT SCAN 2012   IMPRESSION:  1. Severe centrilobular emphysema. No other explanation for cough. No acute process in the chest. 2. Nodularity in the right upper lobe which is similar back to 08/06/2007, consistent with scarring.  Original Report Authenticated By: Consuello Bossier, M.D.       Last Resulted: 01/19/11 9:18 AM     ASSESSMENT AND PLAN:

## 2012-05-01 ENCOUNTER — Encounter (HOSPITAL_COMMUNITY): Payer: BC Managed Care – PPO

## 2012-05-06 ENCOUNTER — Encounter (HOSPITAL_COMMUNITY)
Admission: RE | Admit: 2012-05-06 | Discharge: 2012-05-06 | Disposition: A | Discharge status: Home / Self Care | Payer: BC Managed Care – PPO | Source: Ambulatory Visit | Attending: Pulmonary Disease | Admitting: Pulmonary Disease

## 2012-05-06 DIAGNOSIS — J449 Chronic obstructive pulmonary disease, unspecified: Secondary | ICD-10-CM | POA: Insufficient documentation

## 2012-05-06 DIAGNOSIS — I447 Left bundle-branch block, unspecified: Secondary | ICD-10-CM | POA: Insufficient documentation

## 2012-05-06 DIAGNOSIS — E785 Hyperlipidemia, unspecified: Secondary | ICD-10-CM | POA: Insufficient documentation

## 2012-05-06 DIAGNOSIS — J4489 Other specified chronic obstructive pulmonary disease: Secondary | ICD-10-CM | POA: Insufficient documentation

## 2012-05-06 DIAGNOSIS — E042 Nontoxic multinodular goiter: Secondary | ICD-10-CM | POA: Insufficient documentation

## 2012-05-06 DIAGNOSIS — I10 Essential (primary) hypertension: Secondary | ICD-10-CM | POA: Insufficient documentation

## 2012-05-06 DIAGNOSIS — Z5189 Encounter for other specified aftercare: Secondary | ICD-10-CM | POA: Insufficient documentation

## 2012-05-06 DIAGNOSIS — I2510 Atherosclerotic heart disease of native coronary artery without angina pectoris: Secondary | ICD-10-CM | POA: Insufficient documentation

## 2012-05-08 ENCOUNTER — Encounter (HOSPITAL_COMMUNITY): Payer: BC Managed Care – PPO

## 2012-05-13 ENCOUNTER — Encounter (HOSPITAL_COMMUNITY): Payer: BC Managed Care – PPO

## 2012-05-15 ENCOUNTER — Encounter (HOSPITAL_COMMUNITY)
Admission: RE | Admit: 2012-05-15 | Discharge: 2012-05-15 | Disposition: A | Discharge status: Home / Self Care | Payer: BC Managed Care – PPO | Source: Ambulatory Visit | Attending: Pulmonary Disease | Admitting: Pulmonary Disease

## 2012-05-18 NOTE — Assessment & Plan Note (Signed)
Controlled.  

## 2012-05-18 NOTE — Assessment & Plan Note (Signed)
This has been managed by Dr. [name].

## 2012-05-18 NOTE — Assessment & Plan Note (Signed)
See 2008 cath report.  No critical disease.  Disease is non obstructive.  Would simply recommend lipid lowering agents with treatment to goal.

## 2012-05-18 NOTE — Assessment & Plan Note (Signed)
Chronic finding

## 2012-05-20 ENCOUNTER — Encounter (HOSPITAL_COMMUNITY)
Admission: RE | Admit: 2012-05-20 | Discharge: 2012-05-20 | Disposition: A | Discharge status: Home / Self Care | Payer: BC Managed Care – PPO | Source: Ambulatory Visit | Attending: Pulmonary Disease | Admitting: Pulmonary Disease

## 2012-05-21 ENCOUNTER — Other Ambulatory Visit: Payer: Self-pay

## 2012-05-21 ENCOUNTER — Other Ambulatory Visit: Payer: Self-pay | Admitting: Internal Medicine

## 2012-05-21 ENCOUNTER — Other Ambulatory Visit (INDEPENDENT_AMBULATORY_CARE_PROVIDER_SITE_OTHER): Payer: BC Managed Care – PPO | Admitting: Internal Medicine

## 2012-05-21 VITALS — Temp 97.7°F

## 2012-05-21 DIAGNOSIS — R300 Dysuria: Secondary | ICD-10-CM

## 2012-05-21 LAB — POCT URINALYSIS DIPSTICK
Bilirubin, UA: NEGATIVE
Glucose, UA: NEGATIVE
Ketones, UA: NEGATIVE
Nitrite, UA: NEGATIVE
Spec Grav, UA: >=1.03
Urobilinogen, UA: NEGATIVE
pH, UA: 6

## 2012-05-21 MED ORDER — AMOXICILLIN-POT CLAVULANATE 500-125 MG PO TABS: 1.0000 | ORAL_TABLET | Freq: Three times a day (TID) | ORAL | Status: AC

## 2012-05-22 ENCOUNTER — Encounter (HOSPITAL_COMMUNITY)
Admission: RE | Admit: 2012-05-22 | Discharge: 2012-05-22 | Disposition: A | Discharge status: Home / Self Care | Payer: BC Managed Care – PPO | Source: Ambulatory Visit | Attending: Pulmonary Disease | Admitting: Pulmonary Disease

## 2012-05-22 ENCOUNTER — Encounter: Payer: Self-pay | Admitting: Internal Medicine

## 2012-05-22 NOTE — Progress Notes (Signed)
Previous urine culture from urologist grew E Coli- treated with Cephalexin, which she finished a week ago. Became symptomatic yesterday. Urine sent for culture, per Dr. [name]

## 2012-05-23 ENCOUNTER — Ambulatory Visit (INDEPENDENT_AMBULATORY_CARE_PROVIDER_SITE_OTHER): Payer: BC Managed Care – PPO | Admitting: Pulmonary Disease

## 2012-05-23 ENCOUNTER — Encounter: Payer: Self-pay | Admitting: Pulmonary Disease

## 2012-05-23 VITALS — BP 122/76 | HR 79 | Temp 98.3°F | Ht 63.0 in | Wt 157.8 lb

## 2012-05-23 DIAGNOSIS — J449 Chronic obstructive pulmonary disease, unspecified: Secondary | ICD-10-CM

## 2012-05-23 NOTE — Progress Notes (Signed)
  Subjective:    Patient ID: [name], female    DOB: May 25, 1951, 61 y.o.   MRN: [id number]  HPI The patient comes in today for followup of her known COPD with chronic respiratory failure.  She has been doing fairly well on her current maintenance bronchodilators, and has been using oxygen compliantly.  She is participating in pulmonary rehabilitation at [facility].  She did have a recent episode of acute bronchitis while on a cruise, but is back to baseline after her course of antibiotics.  She feels that her exertional tolerance is at baseline, and that she is doing well overall.   Review of Systems  Constitutional: Negative for fever and unexpected weight change.  HENT: Negative for ear pain, nosebleeds, congestion, sore throat, rhinorrhea, sneezing, trouble swallowing, dental problem, postnasal drip and sinus pressure.   Eyes: Negative for redness and itching.  Respiratory: Negative for cough, chest tightness, shortness of breath and wheezing.   Cardiovascular: Negative for palpitations and leg swelling.  Gastrointestinal: Negative for nausea and vomiting.  Genitourinary: Negative for dysuria.  Musculoskeletal: Negative for joint swelling.  Skin: Negative for rash.  Neurological: Negative for headaches.  Hematological: Does not bruise/bleed easily.  Psychiatric/Behavioral: Negative for dysphoric mood. The patient is not nervous/anxious.   All other systems reviewed and are negative.       Objective:   Physical Exam Overweight female in no acute distress Nose without purulence or discharge noted Chest with decreased breath sounds, but no wheezing or rhonchi. Cardiac exam with regular rate and rhythm Lower extremities with no significant edema, no cyanosis Alert and oriented, moves all 4 extremities.       Assessment & Plan:

## 2012-05-23 NOTE — Assessment & Plan Note (Signed)
The patient seems to be doing well from a pulmonary standpoint, although she did have a recent acute bronchitis while on a cruise out of the country.  She feels that she is back to baseline, and is working hard in pulmonary rehabilitation.  I have asked her to continue on her current medications as well as her oxygen.

## 2012-05-23 NOTE — Patient Instructions (Addendum)
Continue on spiriva and dulera for maintenance.   Can use xopenex 2 puffs up to every 6 hrs for rescue only.  Stay active in pulmonary rehab. Keep scheduled followup apptm with me.

## 2012-05-24 ENCOUNTER — Other Ambulatory Visit: Payer: Self-pay | Admitting: Internal Medicine

## 2012-05-24 DIAGNOSIS — N390 Urinary tract infection, site not specified: Secondary | ICD-10-CM

## 2012-05-24 LAB — URINE CULTURE: Colony Count: >=100000

## 2012-05-24 MED ORDER — CEPHALEXIN 500 MG PO TABS: 500.0000 mg | ORAL_TABLET | Freq: Four times a day (QID) | ORAL | Status: AC

## 2012-05-27 ENCOUNTER — Encounter (HOSPITAL_COMMUNITY)
Admission: RE | Admit: 2012-05-27 | Discharge: 2012-05-27 | Disposition: A | Discharge status: Home / Self Care | Payer: BC Managed Care – PPO | Source: Ambulatory Visit | Attending: Pulmonary Disease | Admitting: Pulmonary Disease

## 2012-05-29 ENCOUNTER — Encounter (HOSPITAL_COMMUNITY)
Admission: RE | Admit: 2012-05-29 | Discharge: 2012-05-29 | Disposition: A | Discharge status: Home / Self Care | Payer: BC Managed Care – PPO | Source: Ambulatory Visit | Attending: Pulmonary Disease | Admitting: Pulmonary Disease

## 2012-06-03 ENCOUNTER — Encounter (HOSPITAL_COMMUNITY)
Admission: RE | Admit: 2012-06-03 | Discharge: 2012-06-03 | Disposition: A | Discharge status: Home / Self Care | Payer: BC Managed Care – PPO | Source: Ambulatory Visit | Attending: Pulmonary Disease | Admitting: Pulmonary Disease

## 2012-06-05 ENCOUNTER — Encounter (HOSPITAL_COMMUNITY)
Admission: RE | Admit: 2012-06-05 | Discharge: 2012-06-05 | Disposition: A | Discharge status: Home / Self Care | Payer: BC Managed Care – PPO | Source: Ambulatory Visit | Attending: Pulmonary Disease | Admitting: Pulmonary Disease

## 2012-06-05 DIAGNOSIS — J449 Chronic obstructive pulmonary disease, unspecified: Secondary | ICD-10-CM | POA: Insufficient documentation

## 2012-06-05 DIAGNOSIS — I447 Left bundle-branch block, unspecified: Secondary | ICD-10-CM | POA: Insufficient documentation

## 2012-06-05 DIAGNOSIS — J4489 Other specified chronic obstructive pulmonary disease: Secondary | ICD-10-CM | POA: Insufficient documentation

## 2012-06-05 DIAGNOSIS — Z5189 Encounter for other specified aftercare: Secondary | ICD-10-CM | POA: Insufficient documentation

## 2012-06-05 DIAGNOSIS — I2510 Atherosclerotic heart disease of native coronary artery without angina pectoris: Secondary | ICD-10-CM | POA: Insufficient documentation

## 2012-06-05 DIAGNOSIS — E785 Hyperlipidemia, unspecified: Secondary | ICD-10-CM | POA: Insufficient documentation

## 2012-06-05 DIAGNOSIS — I10 Essential (primary) hypertension: Secondary | ICD-10-CM | POA: Insufficient documentation

## 2012-06-05 DIAGNOSIS — E042 Nontoxic multinodular goiter: Secondary | ICD-10-CM | POA: Insufficient documentation

## 2012-06-10 ENCOUNTER — Encounter (HOSPITAL_COMMUNITY)
Admission: RE | Admit: 2012-06-10 | Discharge: 2012-06-10 | Disposition: A | Discharge status: Home / Self Care | Payer: BC Managed Care – PPO | Source: Ambulatory Visit | Attending: Pulmonary Disease | Admitting: Pulmonary Disease

## 2012-06-12 ENCOUNTER — Encounter (HOSPITAL_COMMUNITY)
Admission: RE | Admit: 2012-06-12 | Discharge: 2012-06-12 | Disposition: A | Discharge status: Home / Self Care | Payer: BC Managed Care – PPO | Source: Ambulatory Visit | Attending: Pulmonary Disease | Admitting: Pulmonary Disease

## 2012-06-12 ENCOUNTER — Other Ambulatory Visit: Payer: BC Managed Care – PPO | Admitting: Internal Medicine

## 2012-06-12 DIAGNOSIS — Z79899 Other long term (current) drug therapy: Secondary | ICD-10-CM

## 2012-06-12 DIAGNOSIS — E785 Hyperlipidemia, unspecified: Secondary | ICD-10-CM

## 2012-06-12 LAB — LIPID PANEL
Cholesterol: 245 mg/dL — ABNORMAL HIGH (ref 0–200)
HDL: 59 mg/dL (ref >39)
LDL Cholesterol: 140 mg/dL — ABNORMAL HIGH (ref 0–99)
Total CHOL/HDL Ratio: 4.2 Ratio
Triglycerides: 232 mg/dL — ABNORMAL HIGH (ref <150)
VLDL: 46 mg/dL — ABNORMAL HIGH (ref 0–40)

## 2012-06-12 LAB — HEPATIC FUNCTION PANEL
ALT: 47 U/L — ABNORMAL HIGH (ref 0–35)
AST: 32 U/L (ref 0–37)
Albumin: 4.5 g/dL (ref 3.5–5.2)
Alkaline Phosphatase: 59 U/L (ref 39–117)
Bilirubin, Direct: 0.1 mg/dL (ref 0.0–0.3)
Indirect Bilirubin: 0.3 mg/dL (ref 0.0–0.9)
Total Bilirubin: 0.4 mg/dL (ref 0.3–1.2)
Total Protein: 7.3 g/dL (ref 6.0–8.3)

## 2012-06-13 ENCOUNTER — Encounter: Payer: Self-pay | Admitting: Internal Medicine

## 2012-06-13 ENCOUNTER — Ambulatory Visit (INDEPENDENT_AMBULATORY_CARE_PROVIDER_SITE_OTHER): Payer: BC Managed Care – PPO | Admitting: Internal Medicine

## 2012-06-13 VITALS — BP 132/80 | HR 68 | Temp 97.3°F | Ht 63.0 in | Wt 157.0 lb

## 2012-06-13 DIAGNOSIS — N390 Urinary tract infection, site not specified: Secondary | ICD-10-CM

## 2012-06-13 DIAGNOSIS — F22 Delusional disorders: Secondary | ICD-10-CM

## 2012-06-13 LAB — POCT URINALYSIS DIPSTICK
Bilirubin, UA: NEGATIVE
Blood, UA: NEGATIVE
Glucose, UA: NEGATIVE
Ketones, UA: NEGATIVE
Leukocytes, UA: NEGATIVE
Nitrite, UA: NEGATIVE
Protein, UA: NEGATIVE
Spec Grav, UA: 1.015
Urobilinogen, UA: NEGATIVE
pH, UA: 5.5

## 2012-06-14 ENCOUNTER — Encounter: Payer: Self-pay | Admitting: Internal Medicine

## 2012-06-14 NOTE — Progress Notes (Signed)
  Subjective:    Patient ID: [name], female    DOB: May 06, 1951, 61 y.o.   MRN: [id number]  HPI 61 year old white female in today for follow up on hypertension, hyperlipidemia, recent urinary tract infection, COPD. Planning trip to [facility] next week to see her son play in [place]. Planning a trip in November to [place]. Continues to go to pulmonary rehabilitation. Medical problems are stable. Still on chronic oxygen therapy. Travels with the oxygen concentrator. She looks good. Seems to have some energy.    Review of Systems     Objective:   Physical Exam neck is supple without thyromegaly or carotid bruits; chest clear to auscultation; cardiac exam regular rate and rhythm normal S1 and S2; extremities without edema. Skin is warm and dry. Review of fasting lipid panel: Total cholesterol 245, triglycerides 232. Total cholesterol is about the same as it was 6 months ago. Triglycerides have increased just a bit but has  been on a recent trip to [place]. She has seen cardiologist recently. Urinalysis is normal status post treatment for urinary tract infection.        Assessment & Plan:  History of recurrent urinary tract infections with ESBL organisms  Hyperlipidemia  Hypertension  COPD requiring oxygen therapy chronically  History of kidney cancer status post partial nephrectomy  Plan: Return in 6 months for physical examination- mid February 2014. Watch diet.

## 2012-06-14 NOTE — Patient Instructions (Addendum)
Continue same medications and return for physical exam in 6 months. Call if urinary tract infection symptoms recur

## 2012-06-17 ENCOUNTER — Encounter (HOSPITAL_COMMUNITY): Payer: BC Managed Care – PPO

## 2012-06-19 ENCOUNTER — Encounter (HOSPITAL_COMMUNITY): Payer: BC Managed Care – PPO

## 2012-06-24 ENCOUNTER — Encounter (HOSPITAL_COMMUNITY): Payer: BC Managed Care – PPO

## 2012-06-26 ENCOUNTER — Encounter (HOSPITAL_COMMUNITY): Payer: BC Managed Care – PPO

## 2012-07-01 ENCOUNTER — Encounter (HOSPITAL_COMMUNITY)
Admission: RE | Admit: 2012-07-01 | Discharge: 2012-07-01 | Disposition: A | Discharge status: Home / Self Care | Payer: BC Managed Care – PPO | Source: Ambulatory Visit | Attending: Pulmonary Disease | Admitting: Pulmonary Disease

## 2012-07-02 ENCOUNTER — Other Ambulatory Visit: Payer: Self-pay | Admitting: Internal Medicine

## 2012-07-03 ENCOUNTER — Other Ambulatory Visit: Payer: Self-pay | Admitting: Internal Medicine

## 2012-07-03 ENCOUNTER — Encounter (HOSPITAL_COMMUNITY): Payer: BC Managed Care – PPO

## 2012-07-08 ENCOUNTER — Encounter (HOSPITAL_COMMUNITY)
Admission: RE | Admit: 2012-07-08 | Discharge: 2012-07-08 | Disposition: A | Discharge status: Home / Self Care | Payer: BC Managed Care – PPO | Source: Ambulatory Visit | Attending: Pulmonary Disease | Admitting: Pulmonary Disease

## 2012-07-08 DIAGNOSIS — E042 Nontoxic multinodular goiter: Secondary | ICD-10-CM | POA: Insufficient documentation

## 2012-07-08 DIAGNOSIS — I447 Left bundle-branch block, unspecified: Secondary | ICD-10-CM | POA: Insufficient documentation

## 2012-07-08 DIAGNOSIS — I2510 Atherosclerotic heart disease of native coronary artery without angina pectoris: Secondary | ICD-10-CM | POA: Insufficient documentation

## 2012-07-08 DIAGNOSIS — I10 Essential (primary) hypertension: Secondary | ICD-10-CM | POA: Insufficient documentation

## 2012-07-08 DIAGNOSIS — Z5189 Encounter for other specified aftercare: Secondary | ICD-10-CM | POA: Insufficient documentation

## 2012-07-08 DIAGNOSIS — J4489 Other specified chronic obstructive pulmonary disease: Secondary | ICD-10-CM | POA: Insufficient documentation

## 2012-07-08 DIAGNOSIS — J449 Chronic obstructive pulmonary disease, unspecified: Secondary | ICD-10-CM | POA: Insufficient documentation

## 2012-07-08 DIAGNOSIS — E785 Hyperlipidemia, unspecified: Secondary | ICD-10-CM | POA: Insufficient documentation

## 2012-07-10 ENCOUNTER — Encounter (HOSPITAL_COMMUNITY)
Admission: RE | Admit: 2012-07-10 | Discharge: 2012-07-10 | Disposition: A | Discharge status: Home / Self Care | Payer: BC Managed Care – PPO | Source: Ambulatory Visit | Attending: Pulmonary Disease | Admitting: Pulmonary Disease

## 2012-07-14 ENCOUNTER — Encounter: Payer: Self-pay | Admitting: Pulmonary Disease

## 2012-07-14 ENCOUNTER — Ambulatory Visit (INDEPENDENT_AMBULATORY_CARE_PROVIDER_SITE_OTHER): Payer: BC Managed Care – PPO | Admitting: Pulmonary Disease

## 2012-07-14 VITALS — BP 150/88 | HR 80 | Temp 98.9°F | Ht 63.0 in | Wt 160.6 lb

## 2012-07-14 DIAGNOSIS — J449 Chronic obstructive pulmonary disease, unspecified: Secondary | ICD-10-CM

## 2012-07-14 NOTE — Patient Instructions (Addendum)
No change in medications. Please call either [place] or Dr. [name] to get prednisone and an antibiotic to take with you on your overseas trip. Make sure you get your flu shot before leaving on vacation. followup with me in 6mos.

## 2012-07-14 NOTE — Assessment & Plan Note (Signed)
The patient appears to be stable from a pulmonary standpoint.  She is compliant with her medications and oxygen, and continues to participate in pulmonary rehabilitation.  I have asked her to continue on her current bronchodilators, and to try and work on weight loss if possible.

## 2012-07-14 NOTE — Progress Notes (Signed)
  Subjective:    Patient ID: [name], female    DOB: January 31, 1951, 61 y.o.   MRN: [id number]  HPI The patient comes in today for followup of her known severe COPD with chronic respiratory failure.  She has been doing well her current bronchodilator regimen, and has been participating in pulmonary rehabilitation.  She feels that her breathing is at baseline, and has not had an acute exacerbation or pulmonary infection since last visit.  She denies any significant cough or congestion.   Review of Systems  Constitutional: Negative for fever and unexpected weight change.  HENT: Positive for sinus pressure. Negative for ear pain, nosebleeds, congestion, sore throat, rhinorrhea, sneezing, trouble swallowing, dental problem and postnasal drip.   Eyes: Positive for redness and itching.  Respiratory: Positive for shortness of breath. Negative for cough, chest tightness and wheezing.   Cardiovascular: Positive for palpitations and leg swelling.  Gastrointestinal: Negative for nausea and vomiting.  Genitourinary: Negative for dysuria.  Musculoskeletal: Positive for joint swelling.  Skin: Negative for rash.  Neurological: Positive for headaches.  Hematological: Bruises/bleeds easily.  Psychiatric/Behavioral: Negative for dysphoric mood. The patient is not nervous/anxious.        Objective:   Physical Exam Overweight female in no acute distress Nose without purulence or discharge noted Chest with decreased breath sounds, no wheezes or rhonchi Cardiac exam was regular rate and rhythm Lower extremities with minimal edema, no cyanosis Alert and oriented, moves all 4 extremities.       Assessment & Plan:

## 2012-07-15 ENCOUNTER — Encounter (HOSPITAL_COMMUNITY)
Admission: RE | Admit: 2012-07-15 | Discharge: 2012-07-15 | Disposition: A | Discharge status: Home / Self Care | Payer: BC Managed Care – PPO | Source: Ambulatory Visit | Attending: Pulmonary Disease | Admitting: Pulmonary Disease

## 2012-07-17 ENCOUNTER — Encounter (HOSPITAL_COMMUNITY)
Admission: RE | Admit: 2012-07-17 | Discharge: 2012-07-17 | Disposition: A | Discharge status: Home / Self Care | Payer: BC Managed Care – PPO | Source: Ambulatory Visit | Attending: Pulmonary Disease | Admitting: Pulmonary Disease

## 2012-07-22 ENCOUNTER — Encounter (HOSPITAL_COMMUNITY)
Admission: RE | Admit: 2012-07-22 | Discharge: 2012-07-22 | Disposition: A | Discharge status: Home / Self Care | Payer: BC Managed Care – PPO | Source: Ambulatory Visit | Attending: Pulmonary Disease | Admitting: Pulmonary Disease

## 2012-07-24 ENCOUNTER — Encounter (HOSPITAL_COMMUNITY)
Admission: RE | Admit: 2012-07-24 | Discharge: 2012-07-24 | Disposition: A | Discharge status: Home / Self Care | Payer: BC Managed Care – PPO | Source: Ambulatory Visit | Attending: Pulmonary Disease | Admitting: Pulmonary Disease

## 2012-07-24 ENCOUNTER — Ambulatory Visit (INDEPENDENT_AMBULATORY_CARE_PROVIDER_SITE_OTHER): Payer: BC Managed Care – PPO | Admitting: Internal Medicine

## 2012-07-24 DIAGNOSIS — Z23 Encounter for immunization: Secondary | ICD-10-CM

## 2012-07-29 ENCOUNTER — Encounter (HOSPITAL_COMMUNITY): Payer: BC Managed Care – PPO

## 2012-07-31 ENCOUNTER — Encounter (HOSPITAL_COMMUNITY)
Admission: RE | Admit: 2012-07-31 | Discharge: 2012-07-31 | Disposition: A | Discharge status: Home / Self Care | Payer: BC Managed Care – PPO | Source: Ambulatory Visit | Attending: Pulmonary Disease | Admitting: Pulmonary Disease

## 2012-08-04 ENCOUNTER — Telehealth: Payer: Self-pay | Admitting: Internal Medicine

## 2012-08-04 NOTE — Telephone Encounter (Signed)
Patient complaining of fluid retention and shortness of breath. She has an upcoming trip overseas in November and is concerned. She continues to go to pulmonary rehabilitation. Unable to come in today because one of her cats is having a C-section. Appointment given for 11 AM tomorrow.

## 2012-08-05 ENCOUNTER — Ambulatory Visit
Admission: RE | Admit: 2012-08-05 | Discharge: 2012-08-05 | Disposition: A | Discharge status: Home / Self Care | Payer: BC Managed Care – PPO | Source: Ambulatory Visit | Attending: Internal Medicine | Admitting: Internal Medicine

## 2012-08-05 ENCOUNTER — Telehealth: Payer: Self-pay | Admitting: Internal Medicine

## 2012-08-05 ENCOUNTER — Ambulatory Visit (INDEPENDENT_AMBULATORY_CARE_PROVIDER_SITE_OTHER): Payer: BC Managed Care – PPO | Admitting: Internal Medicine

## 2012-08-05 ENCOUNTER — Encounter: Payer: Self-pay | Admitting: Internal Medicine

## 2012-08-05 ENCOUNTER — Encounter (HOSPITAL_COMMUNITY)
Admission: RE | Admit: 2012-08-05 | Discharge: 2012-08-05 | Disposition: A | Discharge status: Home / Self Care | Payer: BC Managed Care – PPO | Source: Ambulatory Visit | Attending: Pulmonary Disease | Admitting: Pulmonary Disease

## 2012-08-05 VITALS — BP 150/86 | HR 80 | Temp 97.9°F | Wt 157.0 lb

## 2012-08-05 DIAGNOSIS — R059 Cough, unspecified: Secondary | ICD-10-CM

## 2012-08-05 DIAGNOSIS — J449 Chronic obstructive pulmonary disease, unspecified: Secondary | ICD-10-CM | POA: Insufficient documentation

## 2012-08-05 DIAGNOSIS — R32 Unspecified urinary incontinence: Secondary | ICD-10-CM

## 2012-08-05 DIAGNOSIS — R300 Dysuria: Secondary | ICD-10-CM

## 2012-08-05 DIAGNOSIS — Z5189 Encounter for other specified aftercare: Secondary | ICD-10-CM | POA: Insufficient documentation

## 2012-08-05 DIAGNOSIS — R0989 Other specified symptoms and signs involving the circulatory and respiratory systems: Secondary | ICD-10-CM

## 2012-08-05 DIAGNOSIS — I10 Essential (primary) hypertension: Secondary | ICD-10-CM | POA: Insufficient documentation

## 2012-08-05 DIAGNOSIS — Z87440 Personal history of urinary (tract) infections: Secondary | ICD-10-CM

## 2012-08-05 DIAGNOSIS — Z8669 Personal history of other diseases of the nervous system and sense organs: Secondary | ICD-10-CM

## 2012-08-05 DIAGNOSIS — I447 Left bundle-branch block, unspecified: Secondary | ICD-10-CM | POA: Insufficient documentation

## 2012-08-05 DIAGNOSIS — R0609 Other forms of dyspnea: Secondary | ICD-10-CM

## 2012-08-05 DIAGNOSIS — E785 Hyperlipidemia, unspecified: Secondary | ICD-10-CM

## 2012-08-05 DIAGNOSIS — J309 Allergic rhinitis, unspecified: Secondary | ICD-10-CM

## 2012-08-05 DIAGNOSIS — Z9981 Dependence on supplemental oxygen: Secondary | ICD-10-CM

## 2012-08-05 DIAGNOSIS — I2510 Atherosclerotic heart disease of native coronary artery without angina pectoris: Secondary | ICD-10-CM | POA: Insufficient documentation

## 2012-08-05 DIAGNOSIS — E8779 Other fluid overload: Secondary | ICD-10-CM

## 2012-08-05 DIAGNOSIS — R0600 Dyspnea, unspecified: Secondary | ICD-10-CM

## 2012-08-05 DIAGNOSIS — R05 Cough: Secondary | ICD-10-CM

## 2012-08-05 DIAGNOSIS — J4489 Other specified chronic obstructive pulmonary disease: Secondary | ICD-10-CM | POA: Insufficient documentation

## 2012-08-05 DIAGNOSIS — E042 Nontoxic multinodular goiter: Secondary | ICD-10-CM | POA: Insufficient documentation

## 2012-08-05 DIAGNOSIS — R609 Edema, unspecified: Secondary | ICD-10-CM

## 2012-08-05 LAB — CBC WITH DIFFERENTIAL/PLATELET
Basophils Absolute: 0 10*3/uL (ref 0.0–0.1)
Basophils Relative: 0 % (ref 0–1)
Eosinophils Absolute: 0.2 10*3/uL (ref 0.0–0.7)
Eosinophils Relative: 2 % (ref 0–5)
HCT: 42.1 % (ref 36.0–46.0)
Hemoglobin: 14.2 g/dL (ref 12.0–15.0)
Lymphocytes Relative: 24 % (ref 12–46)
Lymphs Abs: 1.6 10*3/uL (ref 0.7–4.0)
MCH: 31.9 pg (ref 26.0–34.0)
MCHC: 33.7 g/dL (ref 30.0–36.0)
MCV: 94.6 fL (ref 78.0–100.0)
Monocytes Absolute: 0.6 10*3/uL (ref 0.1–1.0)
Monocytes Relative: 10 % (ref 3–12)
Neutro Abs: 4.3 10*3/uL (ref 1.7–7.7)
Neutrophils Relative %: 64 % (ref 43–77)
Platelets: 219 10*3/uL (ref 150–400)
RBC: 4.45 MIL/uL (ref 3.87–5.11)
RDW: 13.3 % (ref 11.5–15.5)
WBC: 6.8 10*3/uL (ref 4.0–10.5)

## 2012-08-05 LAB — POCT URINALYSIS DIPSTICK
Bilirubin, UA: NEGATIVE
Blood, UA: NEGATIVE
Clarity, UA: NEGATIVE
Glucose, UA: NEGATIVE
Ketones, UA: NEGATIVE
Leukocytes, UA: NEGATIVE
Nitrite, UA: NEGATIVE
Protein, UA: NEGATIVE
Spec Grav, UA: 1.01
Urobilinogen, UA: NEGATIVE
pH, UA: 6

## 2012-08-05 MED ORDER — METHYLPREDNISOLONE ACETATE 80 MG/ML IJ SUSP
80.0000 mg | Freq: Once | INTRAMUSCULAR | Status: AC
  Administered 2012-08-05: 80 mg via INTRAMUSCULAR

## 2012-08-05 NOTE — Patient Instructions (Addendum)
Please have chest x-ray done. Lab work results are pending. Complete 10 days of cephalexin for urinary symptoms and then discontinue and await urine culture results. You have been given Depo-Medrol 80 mg IM today.

## 2012-08-06 DIAGNOSIS — Z9981 Dependence on supplemental oxygen: Secondary | ICD-10-CM | POA: Insufficient documentation

## 2012-08-06 LAB — COMPREHENSIVE METABOLIC PANEL
ALT: 63 U/L — ABNORMAL HIGH (ref 0–35)
AST: 44 U/L — ABNORMAL HIGH (ref 0–37)
Albumin: 4.6 g/dL (ref 3.5–5.2)
Alkaline Phosphatase: 65 U/L (ref 39–117)
BUN: 19 mg/dL (ref 6–23)
CO2: 25 mEq/L (ref 19–32)
Calcium: 10.1 mg/dL (ref 8.4–10.5)
Chloride: 103 mEq/L (ref 96–112)
Creat: 0.92 mg/dL (ref 0.50–1.10)
Glucose, Bld: 96 mg/dL (ref 70–99)
Potassium: 4 mEq/L (ref 3.5–5.3)
Sodium: 140 mEq/L (ref 135–145)
Total Bilirubin: 0.4 mg/dL (ref 0.3–1.2)
Total Protein: 7 g/dL (ref 6.0–8.3)

## 2012-08-06 LAB — TSH: TSH: 1.942 u[IU]/mL (ref 0.350–4.500)

## 2012-08-06 LAB — T4, FREE: Free T4: 1.43 ng/dL (ref 0.80–1.80)

## 2012-08-06 LAB — BRAIN NATRIURETIC PEPTIDE: Brain Natriuretic Peptide: 23.7 pg/mL (ref 0.0–100.0)

## 2012-08-06 NOTE — Progress Notes (Signed)
  Subjective:    Patient ID: [name], female    DOB: [birth year], 61 y.o.   MRN: [id number]  HPI 61 year old white female with history of COPD oxygen dependent, coronary artery disease, hypertension, hyperlipidemia, recurrent urinary tract infections, history of renal cell carcinoma, ureteral reflux call saying she has been more short of breath recently. Her husband sprayed some preparation on the yard and she thinks this may have triggered some allergy symptoms . Thinks she is having some fluid retention. She's going on a trip abroad in early November with be checked out prior to this trip. Thinks  feet have been more puffy recently. She is going to pulmonary rehabilitation and is able to do the exercises without difficulty. Blood pressure is elevated in the office today but she says it was taken at pulmonary rehabilitation and was quite good. No cough sore throat fever or shaking chills. Urinalysis checked today and is entirely within normal limits. She recently started a course of Keflex while visiting in the [place] area babysitting her grandchildren. She thought she had a urinary tract infection coming on and had some Keflex to take. She still on this. History of ESBL urinary tract infection in the past. Has been on Keflex now for about a week.    Review of Systems     Objective:   Physical Exam neck is supple without thyromegaly JVD or carotid bruits; chest clear to auscultation; cardiac exam regular rate and rhythm normal S1 and S2. Pulse oximetry is 98% on 3 L oxygen. No pitting edema of the lower extremity. Has some tenderness dorsum of left foot. Skin is warm and dry.        Assessment & Plan:    Allergic rhinitis  Fluid retention- rule out congestive heart failure- BNP, TSH drawn. CBC, C. met drawn. Chest x-ray pending  COPD- oxygen dependent  History of recurrent urinary tract infections. History of ESBL urinary infection.  Hypertension  Hyperlipidemia  Plan: Patient  planning to travel abroad in early November. She is requesting prescription for Levaquin to take on trip. Given Levaquin 500 mg #10 one by mouth daily with no refill. Also she is requesting prescription for Augmentin to take on trip. Given Augmentin 500 mg #20 one by mouth twice daily with 1 refill. Patient is to complete ten-day course of cephalexin for recent presumed urinary tract infection although she was not checked in the office because she was out of town. She started this on her own. Urine culture is been obtained today. Urinalysis is normal. Once completing 10 days of cephalexin she is to stop all antibiotics and see if symptoms improve. She will have chest x-ray to rule out congestive heart failure.  Given 80 mg IM Depo-Medrol in the office today for allergic rhinitis symptoms.

## 2012-08-07 ENCOUNTER — Encounter (HOSPITAL_COMMUNITY): Payer: BC Managed Care – PPO

## 2012-08-07 LAB — URINE CULTURE
Colony Count: NO GROWTH
Organism ID, Bacteria: NO GROWTH

## 2012-08-12 ENCOUNTER — Encounter (HOSPITAL_COMMUNITY)
Admission: RE | Admit: 2012-08-12 | Discharge: 2012-08-12 | Disposition: A | Discharge status: Home / Self Care | Payer: BC Managed Care – PPO | Source: Ambulatory Visit | Attending: Pulmonary Disease | Admitting: Pulmonary Disease

## 2012-08-14 ENCOUNTER — Encounter (HOSPITAL_COMMUNITY): Payer: BC Managed Care – PPO

## 2012-08-15 ENCOUNTER — Ambulatory Visit (INDEPENDENT_AMBULATORY_CARE_PROVIDER_SITE_OTHER): Payer: BC Managed Care – PPO | Admitting: Internal Medicine

## 2012-08-15 ENCOUNTER — Encounter: Payer: Self-pay | Admitting: Internal Medicine

## 2012-08-15 VITALS — BP 160/84 | HR 76 | Temp 98.4°F

## 2012-08-15 DIAGNOSIS — R300 Dysuria: Secondary | ICD-10-CM

## 2012-08-15 LAB — POCT URINALYSIS DIPSTICK
Bilirubin, UA: NEGATIVE
Blood, UA: NEGATIVE
Glucose, UA: NEGATIVE
Ketones, UA: NEGATIVE
Leukocytes, UA: NEGATIVE
Nitrite, UA: NEGATIVE
Protein, UA: NEGATIVE
Spec Grav, UA: 1.015
Urobilinogen, UA: NEGATIVE
pH, UA: 5.5

## 2012-08-15 NOTE — Progress Notes (Signed)
  Subjective:    Patient ID: [name], female    DOB: [birth year], 61 y.o.   MRN: [id number]  HPI 61 year old white female with multiple medical problems including hypertension, hyperlipidemia coronary artery disease, COPD oxygen dependent, recurrent urinary tract infections, status post partial nephrectomy for renal cell carcinoma in today with complaint of dysuria. Thought she saw pus in her urine this morning. Had some back pain and discomfort with urinating. Started taking some cranberry preparation recommended by urologist. Feeling a bit better.    Review of Systems     Objective:   Physical Exam No CVA tenderness. Chest clear to auscultation cardiac exam regular rate and rhythm extremities without edema. Clean catch urinalysis shows pH 5.5, specific gravity 1.010 without blood or leukocytes.       Assessment & Plan:  Dysuria  History of recurrent urinary tract infections  History of partial nephrectomy for renal cell carcinoma  COPD-oxygen dependent  Hypertension  Plan: Await urine culture results and continue with cranberry preparation recommended by urologist.

## 2012-08-15 NOTE — Patient Instructions (Addendum)
Await urine culture results

## 2012-08-15 NOTE — Addendum Note (Signed)
Addended by: Judy Pimple on: 08/15/2012 05:35 PM   Modules accepted: Orders

## 2012-08-19 ENCOUNTER — Encounter (HOSPITAL_COMMUNITY)
Admission: RE | Admit: 2012-08-19 | Discharge: 2012-08-19 | Disposition: A | Discharge status: Home / Self Care | Payer: BC Managed Care – PPO | Source: Ambulatory Visit | Attending: Pulmonary Disease | Admitting: Pulmonary Disease

## 2012-08-20 ENCOUNTER — Other Ambulatory Visit: Payer: Self-pay | Admitting: Internal Medicine

## 2012-08-21 ENCOUNTER — Encounter (HOSPITAL_COMMUNITY)
Admission: RE | Admit: 2012-08-21 | Discharge: 2012-08-21 | Disposition: A | Discharge status: Home / Self Care | Payer: BC Managed Care – PPO | Source: Ambulatory Visit | Attending: Pulmonary Disease | Admitting: Pulmonary Disease

## 2012-08-21 ENCOUNTER — Telehealth: Payer: Self-pay | Admitting: Pulmonary Disease

## 2012-08-21 NOTE — Telephone Encounter (Signed)
Called and spoke to [facility] advised her of Dr. Lenard Lance thoughts on this matter.  Patient agreed with Dr. Shelle Iron and stated that she has been emailing this airline back and forth trying to understand the same thing.  This is [employer] and the patient stated their physician emailed her and insisted this blood gas be obtained. I verified the best I could with the patient that the paper stated BLOOD GAS, and patient stated this was correct Patient is requesting that if ok w Dr. Shelle Iron a letter stating his thoughts on this matter be written for her to show the airline an din the meantime she is still trying to sort this out.

## 2012-08-21 NOTE — Telephone Encounter (Signed)
I spoke with pt and she stated that she is going to fly to [place] in November. 2 of the airlines are requiring pt have blood gas drawn since this value was not placed on form that [employer] filled out. The ill not allow her to carry the oxygen on the plane without this value. Pt is not sure what to do now. Please advise [employer] thanks.

## 2012-08-21 NOTE — Telephone Encounter (Signed)
I have never heard of an airline asking for a blood gas.  This is very inappropriate, and I do NOT think this is part of the rules.  I think someone is misinterpreting what is needed.  There are other ways to clear you for flight, like an oxygen saturation.  This is a very unusual request, and suspect someone is wrong about this. The test is very painful and costs a few hundred dollars.  We cannot do blood gases on our pt's just to fly.

## 2012-08-22 NOTE — Telephone Encounter (Signed)
Pt aware letter completed and requested it be mailed to her. Letter mailed. Copy placed in scan folder.Carron Curie, CMA

## 2012-08-22 NOTE — Telephone Encounter (Signed)
Note sent to triage

## 2012-08-26 ENCOUNTER — Encounter (HOSPITAL_COMMUNITY): Payer: BC Managed Care – PPO

## 2012-08-28 ENCOUNTER — Telehealth: Payer: Self-pay | Admitting: Pulmonary Disease

## 2012-08-28 ENCOUNTER — Encounter (HOSPITAL_COMMUNITY)
Admission: RE | Admit: 2012-08-28 | Discharge: 2012-08-28 | Disposition: A | Discharge status: Home / Self Care | Payer: BC Managed Care – PPO | Source: Ambulatory Visit | Attending: Pulmonary Disease | Admitting: Pulmonary Disease

## 2012-08-28 NOTE — Telephone Encounter (Signed)
Spoke with the pt's spouse. She states that the airline company is requesting her to use 6lpm of o2 when she flies, but he states that she flies all the time and never has required more than 3 lpm. He is requesting note from [facility] stating that the 3 lpm is enough. Once completed, the note needs to be faxed to the pt at [phone]. Please advise, thanks!

## 2012-08-29 NOTE — Telephone Encounter (Signed)
Let her know that she had sat 92% on 3 lpm when in office.  In airplane which is pressurized to 8000 ft, probably will be less.  I will write a note saying you can wear 3-6 liters, but does not have to stay on 6 liters the whole time.  See if she has portable pulse ox that she can take with her.

## 2012-08-29 NOTE — Telephone Encounter (Signed)
LMTCB

## 2012-09-01 NOTE — Telephone Encounter (Signed)
Per pt's spouse, the issue with the airline has been resolved and nothing further is needed.

## 2012-09-02 ENCOUNTER — Encounter (HOSPITAL_COMMUNITY)
Admission: RE | Admit: 2012-09-02 | Discharge: 2012-09-02 | Disposition: A | Discharge status: Home / Self Care | Payer: BC Managed Care – PPO | Source: Ambulatory Visit | Attending: Pulmonary Disease | Admitting: Pulmonary Disease

## 2012-09-04 ENCOUNTER — Encounter (HOSPITAL_COMMUNITY)
Admission: RE | Admit: 2012-09-04 | Discharge: 2012-09-04 | Disposition: A | Discharge status: Home / Self Care | Payer: BC Managed Care – PPO | Source: Ambulatory Visit | Attending: Pulmonary Disease | Admitting: Pulmonary Disease

## 2012-09-09 ENCOUNTER — Other Ambulatory Visit: Payer: Self-pay

## 2012-09-09 ENCOUNTER — Encounter (HOSPITAL_COMMUNITY): Payer: BC Managed Care – PPO

## 2012-09-09 DIAGNOSIS — Z5189 Encounter for other specified aftercare: Secondary | ICD-10-CM | POA: Insufficient documentation

## 2012-09-09 DIAGNOSIS — J449 Chronic obstructive pulmonary disease, unspecified: Secondary | ICD-10-CM | POA: Insufficient documentation

## 2012-09-09 DIAGNOSIS — E042 Nontoxic multinodular goiter: Secondary | ICD-10-CM | POA: Insufficient documentation

## 2012-09-09 DIAGNOSIS — I447 Left bundle-branch block, unspecified: Secondary | ICD-10-CM | POA: Insufficient documentation

## 2012-09-09 DIAGNOSIS — I10 Essential (primary) hypertension: Secondary | ICD-10-CM | POA: Insufficient documentation

## 2012-09-09 DIAGNOSIS — I2510 Atherosclerotic heart disease of native coronary artery without angina pectoris: Secondary | ICD-10-CM | POA: Insufficient documentation

## 2012-09-09 DIAGNOSIS — J4489 Other specified chronic obstructive pulmonary disease: Secondary | ICD-10-CM | POA: Insufficient documentation

## 2012-09-09 DIAGNOSIS — E785 Hyperlipidemia, unspecified: Secondary | ICD-10-CM | POA: Insufficient documentation

## 2012-09-09 MED ORDER — CEPHALEXIN 500 MG PO CAPS: 500.0000 mg | ORAL_CAPSULE | Freq: Four times a day (QID) | ORAL | Status: AC

## 2012-09-09 NOTE — Telephone Encounter (Signed)
Patient requesting this antibiotic to take with her, as she is traveling out of the country.

## 2012-09-11 ENCOUNTER — Encounter (HOSPITAL_COMMUNITY): Payer: BC Managed Care – PPO

## 2012-09-16 ENCOUNTER — Encounter (HOSPITAL_COMMUNITY): Payer: BC Managed Care – PPO

## 2012-09-18 ENCOUNTER — Encounter (HOSPITAL_COMMUNITY): Payer: BC Managed Care – PPO

## 2012-09-23 ENCOUNTER — Encounter (HOSPITAL_COMMUNITY): Payer: BC Managed Care – PPO

## 2012-09-25 ENCOUNTER — Encounter (HOSPITAL_COMMUNITY)
Admission: RE | Admit: 2012-09-25 | Discharge: 2012-09-25 | Disposition: A | Discharge status: Home / Self Care | Payer: BC Managed Care – PPO | Source: Ambulatory Visit | Attending: Pulmonary Disease | Admitting: Pulmonary Disease

## 2012-09-30 ENCOUNTER — Encounter (HOSPITAL_COMMUNITY): Payer: BC Managed Care – PPO

## 2012-10-02 ENCOUNTER — Encounter (HOSPITAL_COMMUNITY): Payer: BC Managed Care – PPO

## 2012-10-07 ENCOUNTER — Encounter (HOSPITAL_COMMUNITY): Payer: BC Managed Care – PPO

## 2012-10-07 DIAGNOSIS — E042 Nontoxic multinodular goiter: Secondary | ICD-10-CM | POA: Insufficient documentation

## 2012-10-07 DIAGNOSIS — E785 Hyperlipidemia, unspecified: Secondary | ICD-10-CM | POA: Insufficient documentation

## 2012-10-07 DIAGNOSIS — Z5189 Encounter for other specified aftercare: Secondary | ICD-10-CM | POA: Insufficient documentation

## 2012-10-07 DIAGNOSIS — J4489 Other specified chronic obstructive pulmonary disease: Secondary | ICD-10-CM | POA: Insufficient documentation

## 2012-10-07 DIAGNOSIS — I2510 Atherosclerotic heart disease of native coronary artery without angina pectoris: Secondary | ICD-10-CM | POA: Insufficient documentation

## 2012-10-07 DIAGNOSIS — J449 Chronic obstructive pulmonary disease, unspecified: Secondary | ICD-10-CM | POA: Insufficient documentation

## 2012-10-07 DIAGNOSIS — I447 Left bundle-branch block, unspecified: Secondary | ICD-10-CM | POA: Insufficient documentation

## 2012-10-07 DIAGNOSIS — I10 Essential (primary) hypertension: Secondary | ICD-10-CM | POA: Insufficient documentation

## 2012-10-09 ENCOUNTER — Encounter (HOSPITAL_COMMUNITY): Admission: RE | Admit: 2012-10-09 | Payer: BC Managed Care – PPO | Source: Ambulatory Visit

## 2012-10-14 ENCOUNTER — Encounter (HOSPITAL_COMMUNITY): Payer: BC Managed Care – PPO

## 2012-10-16 ENCOUNTER — Encounter (HOSPITAL_COMMUNITY): Payer: BC Managed Care – PPO

## 2012-10-21 ENCOUNTER — Encounter (HOSPITAL_COMMUNITY)
Admission: RE | Admit: 2012-10-21 | Discharge: 2012-10-21 | Disposition: A | Discharge status: Home / Self Care | Payer: BC Managed Care – PPO | Source: Ambulatory Visit | Attending: Pulmonary Disease | Admitting: Pulmonary Disease

## 2012-10-23 ENCOUNTER — Encounter (HOSPITAL_COMMUNITY)
Admission: RE | Admit: 2012-10-23 | Discharge: 2012-10-23 | Disposition: A | Discharge status: Home / Self Care | Payer: BC Managed Care – PPO | Source: Ambulatory Visit | Attending: Pulmonary Disease | Admitting: Pulmonary Disease

## 2012-10-28 ENCOUNTER — Encounter (HOSPITAL_COMMUNITY): Payer: BC Managed Care – PPO

## 2012-10-30 ENCOUNTER — Encounter (HOSPITAL_COMMUNITY): Payer: BC Managed Care – PPO

## 2012-11-04 ENCOUNTER — Encounter (HOSPITAL_COMMUNITY)
Admission: RE | Admit: 2012-11-04 | Discharge: 2012-11-04 | Disposition: A | Discharge status: Home / Self Care | Payer: BC Managed Care – PPO | Source: Ambulatory Visit | Attending: Pulmonary Disease | Admitting: Pulmonary Disease

## 2012-11-06 ENCOUNTER — Encounter (HOSPITAL_COMMUNITY)
Admission: RE | Admit: 2012-11-06 | Discharge: 2012-11-06 | Disposition: A | Discharge status: Home / Self Care | Payer: BC Managed Care – PPO | Source: Ambulatory Visit | Attending: Pulmonary Disease | Admitting: Pulmonary Disease

## 2012-11-06 DIAGNOSIS — Z5189 Encounter for other specified aftercare: Secondary | ICD-10-CM | POA: Insufficient documentation

## 2012-11-06 DIAGNOSIS — I447 Left bundle-branch block, unspecified: Secondary | ICD-10-CM | POA: Insufficient documentation

## 2012-11-06 DIAGNOSIS — E042 Nontoxic multinodular goiter: Secondary | ICD-10-CM | POA: Insufficient documentation

## 2012-11-06 DIAGNOSIS — I10 Essential (primary) hypertension: Secondary | ICD-10-CM | POA: Insufficient documentation

## 2012-11-06 DIAGNOSIS — J449 Chronic obstructive pulmonary disease, unspecified: Secondary | ICD-10-CM | POA: Insufficient documentation

## 2012-11-06 DIAGNOSIS — I2510 Atherosclerotic heart disease of native coronary artery without angina pectoris: Secondary | ICD-10-CM | POA: Insufficient documentation

## 2012-11-06 DIAGNOSIS — J4489 Other specified chronic obstructive pulmonary disease: Secondary | ICD-10-CM | POA: Insufficient documentation

## 2012-11-06 DIAGNOSIS — E785 Hyperlipidemia, unspecified: Secondary | ICD-10-CM | POA: Insufficient documentation

## 2012-11-11 ENCOUNTER — Encounter (HOSPITAL_COMMUNITY): Payer: BC Managed Care – PPO

## 2012-11-13 ENCOUNTER — Encounter (HOSPITAL_COMMUNITY): Payer: BC Managed Care – PPO

## 2012-11-18 ENCOUNTER — Encounter (HOSPITAL_COMMUNITY)
Admission: RE | Admit: 2012-11-18 | Discharge: 2012-11-18 | Payer: BC Managed Care – PPO | Source: Ambulatory Visit | Attending: Pulmonary Disease | Admitting: Pulmonary Disease

## 2012-11-18 NOTE — Progress Notes (Signed)
Pt completed a 6 minute walk test in pulmonary rehab maintenance program on 11/06/2012 and did fairly well considered she had been out awhile due to colds and illnesses's. Mrs. Baley was placed on 3L NCC and ambulated 1243ft which is less that her previous walk test but she maintained oxygen sats above or equal to 92% the whole time. Patient was pleased with her outcome, When patient brings back quality of life we will address her needs and areas for improvement. Pt doing well in the rehab program and always has a smile on. Pt still doing her resistance training and maintaining good vitals.  Will continue to encourage and support.

## 2012-11-20 ENCOUNTER — Encounter (HOSPITAL_COMMUNITY)
Admission: RE | Admit: 2012-11-20 | Discharge: 2012-11-20 | Disposition: A | Discharge status: Home / Self Care | Payer: BC Managed Care – PPO | Source: Ambulatory Visit | Attending: Pulmonary Disease | Admitting: Pulmonary Disease

## 2012-11-25 ENCOUNTER — Encounter (HOSPITAL_COMMUNITY)
Admission: RE | Admit: 2012-11-25 | Discharge: 2012-11-25 | Disposition: A | Discharge status: Home / Self Care | Payer: BC Managed Care – PPO | Source: Ambulatory Visit | Attending: Pulmonary Disease | Admitting: Pulmonary Disease

## 2012-11-27 ENCOUNTER — Encounter (HOSPITAL_COMMUNITY)
Admission: RE | Admit: 2012-11-27 | Discharge: 2012-11-27 | Disposition: A | Discharge status: Home / Self Care | Payer: BC Managed Care – PPO | Source: Ambulatory Visit | Attending: Pulmonary Disease | Admitting: Pulmonary Disease

## 2012-12-02 ENCOUNTER — Encounter (HOSPITAL_COMMUNITY)
Admission: RE | Admit: 2012-12-02 | Discharge: 2012-12-02 | Disposition: A | Discharge status: Home / Self Care | Payer: BC Managed Care – PPO | Source: Ambulatory Visit | Attending: Pulmonary Disease | Admitting: Pulmonary Disease

## 2012-12-04 ENCOUNTER — Encounter (HOSPITAL_COMMUNITY): Payer: BC Managed Care – PPO

## 2012-12-09 ENCOUNTER — Encounter (HOSPITAL_COMMUNITY)
Admission: RE | Admit: 2012-12-09 | Discharge: 2012-12-09 | Disposition: A | Discharge status: Home / Self Care | Payer: BC Managed Care – PPO | Source: Ambulatory Visit | Attending: Pulmonary Disease | Admitting: Pulmonary Disease

## 2012-12-09 DIAGNOSIS — I10 Essential (primary) hypertension: Secondary | ICD-10-CM | POA: Insufficient documentation

## 2012-12-09 DIAGNOSIS — J4489 Other specified chronic obstructive pulmonary disease: Secondary | ICD-10-CM | POA: Insufficient documentation

## 2012-12-09 DIAGNOSIS — I2510 Atherosclerotic heart disease of native coronary artery without angina pectoris: Secondary | ICD-10-CM | POA: Insufficient documentation

## 2012-12-09 DIAGNOSIS — I447 Left bundle-branch block, unspecified: Secondary | ICD-10-CM | POA: Insufficient documentation

## 2012-12-09 DIAGNOSIS — Z5189 Encounter for other specified aftercare: Secondary | ICD-10-CM | POA: Insufficient documentation

## 2012-12-09 DIAGNOSIS — E785 Hyperlipidemia, unspecified: Secondary | ICD-10-CM | POA: Insufficient documentation

## 2012-12-09 DIAGNOSIS — J449 Chronic obstructive pulmonary disease, unspecified: Secondary | ICD-10-CM | POA: Insufficient documentation

## 2012-12-09 DIAGNOSIS — E042 Nontoxic multinodular goiter: Secondary | ICD-10-CM | POA: Insufficient documentation

## 2012-12-11 ENCOUNTER — Encounter (HOSPITAL_COMMUNITY)
Admission: RE | Admit: 2012-12-11 | Discharge: 2012-12-11 | Disposition: A | Discharge status: Home / Self Care | Payer: BC Managed Care – PPO | Source: Ambulatory Visit | Attending: Pulmonary Disease | Admitting: Pulmonary Disease

## 2012-12-15 ENCOUNTER — Other Ambulatory Visit: Payer: Self-pay | Admitting: Internal Medicine

## 2012-12-16 ENCOUNTER — Encounter (HOSPITAL_COMMUNITY)
Admission: RE | Admit: 2012-12-16 | Discharge: 2012-12-16 | Disposition: A | Discharge status: Home / Self Care | Payer: BC Managed Care – PPO | Source: Ambulatory Visit | Attending: Pulmonary Disease | Admitting: Pulmonary Disease

## 2012-12-18 ENCOUNTER — Encounter (HOSPITAL_COMMUNITY): Payer: BC Managed Care – PPO

## 2012-12-22 ENCOUNTER — Ambulatory Visit (INDEPENDENT_AMBULATORY_CARE_PROVIDER_SITE_OTHER): Payer: BC Managed Care – PPO | Admitting: Cardiology

## 2012-12-22 ENCOUNTER — Encounter: Payer: Self-pay | Admitting: Cardiology

## 2012-12-22 ENCOUNTER — Encounter (INDEPENDENT_AMBULATORY_CARE_PROVIDER_SITE_OTHER): Payer: BC Managed Care – PPO

## 2012-12-22 VITALS — BP 142/86 | HR 64 | Ht 63.0 in | Wt 160.0 lb

## 2012-12-22 DIAGNOSIS — R0989 Other specified symptoms and signs involving the circulatory and respiratory systems: Secondary | ICD-10-CM

## 2012-12-22 DIAGNOSIS — M79606 Pain in leg, unspecified: Secondary | ICD-10-CM

## 2012-12-22 DIAGNOSIS — I10 Essential (primary) hypertension: Secondary | ICD-10-CM

## 2012-12-22 DIAGNOSIS — I2510 Atherosclerotic heart disease of native coronary artery without angina pectoris: Secondary | ICD-10-CM

## 2012-12-22 DIAGNOSIS — R609 Edema, unspecified: Secondary | ICD-10-CM

## 2012-12-22 DIAGNOSIS — M79609 Pain in unspecified limb: Secondary | ICD-10-CM

## 2012-12-22 DIAGNOSIS — E785 Hyperlipidemia, unspecified: Secondary | ICD-10-CM

## 2012-12-22 DIAGNOSIS — Z9981 Dependence on supplemental oxygen: Secondary | ICD-10-CM

## 2012-12-22 DIAGNOSIS — R0600 Dyspnea, unspecified: Secondary | ICD-10-CM

## 2012-12-22 DIAGNOSIS — R0602 Shortness of breath: Secondary | ICD-10-CM

## 2012-12-22 LAB — CBC WITH DIFFERENTIAL/PLATELET
Basophils Absolute: 0 10*3/uL (ref 0.0–0.1)
Basophils Relative: 0.7 % (ref 0.0–3.0)
Eosinophils Absolute: 0.1 10*3/uL (ref 0.0–0.7)
Eosinophils Relative: 2.4 % (ref 0.0–5.0)
HCT: 41 % (ref 36.0–46.0)
Hemoglobin: 13.6 g/dL (ref 12.0–15.0)
Lymphocytes Relative: 23.4 % (ref 12.0–46.0)
Lymphs Abs: 1.4 10*3/uL (ref 0.7–4.0)
MCHC: 33.1 g/dL (ref 30.0–36.0)
MCV: 96.4 fl (ref 78.0–100.0)
Monocytes Absolute: 0.6 10*3/uL (ref 0.1–1.0)
Monocytes Relative: 9.2 % (ref 3.0–12.0)
Neutro Abs: 4 10*3/uL (ref 1.4–7.7)
Neutrophils Relative %: 64.3 % (ref 43.0–77.0)
Platelets: 196 10*3/uL (ref 150.0–400.0)
RBC: 4.25 Mil/uL (ref 3.87–5.11)
RDW: 13.4 % (ref 11.5–14.6)
WBC: 6.2 10*3/uL (ref 4.5–10.5)

## 2012-12-22 NOTE — Progress Notes (Signed)
HPI: This patient returns in follow up.  She has had more trouble as of late.  On the plane to [place] she noted her 02 sats were as low as 65%. She was on a trip to [place], which she and her husband frequently do. The air quality was bad. She has had some problems since about a month ago she had some chest discomfort. The chest lasted most of the day and then went away the next day. She decided not to get evaluated at that point in time. In addition, her shortness of breath has been worse. She's also had swelling in the lower extremities. The right is been somewhat worse than left, and there's been some tenderness in the right leg.  When she was on the plane, the 02 sat was as low as 65%.  She has been shorter of breath, although she is back in pulm rehab, and that is going pretty well right now.  No interim chest pain since last episode.     Current Outpatient Prescriptions  Medication Sig Dispense Refill  . aspirin 325 MG tablet Take 325 mg by mouth daily.        Marland Kitchen atenolol (TENORMIN) 25 MG tablet TAKE ONE TABLET BY MOUTH EVERY DAY  90 tablet  PRN  . AZO-CRANBERRY PO Take 2 tablets by mouth 2 (two) times daily.       Marland Kitchen Bioflavonoid Products (ESTER-C) 1000-50 MG TABS Take 1 tablet by mouth daily.        . butalbital-aspirin-caffeine (FIORINAL) 50-325-40 MG per capsule TAKE ONE TO TWO CAPSULES BY MOUTH EVERY 6 HOURS AS NEEDED FOR HEADACHE. DO NOT EXCEED 6 CAPSULES IN 24 HOURS  30 capsule  5  . cephALEXin (KEFLEX) 500 MG capsule Take 500 mg by mouth 4 (four) times daily.      . cetirizine (ZYRTEC) 10 MG tablet Take 10 mg by mouth daily.       . chlorpheniramine-HYDROcodone (TUSSIONEX) 10-8 MG/5ML LQCR as needed.       . Coenzyme Q10 (COQ10) 100 MG CAPS Take 1 tablet by mouth daily.        . diphenhydrAMINE (BENADRYL) 25 MG tablet Take 25 mg by mouth every 6 (six) hours as needed. 25 or 50 mg as needed      . DULERA 100-5 MCG/ACT AERO INHALE TWO PUFFS BY MOUTH TWICE DAILY  13 g  11  .  estradiol (VAGIFEM) 25 MCG vaginal tablet Insert one vaginally every 3 days       . fish oil-omega-3 fatty acids 1000 MG capsule Take 1 capsule by mouth daily.        . FOSFOMYCIN TROMETHAMINE PO as needed. 1 dose ( 3 grams)  By mouth every 3 days      . GuaiFENesin (MUCINEX PO) Take by mouth as needed.        . hydrochlorothiazide (HYDRODIURIL) 25 MG tablet TAKE ONE TABLET BY MOUTH EVERY DAY  90 tablet  PRN  . HYDROcodone-acetaminophen (VICODIN) 5-500 MG per tablet       . levalbuterol (XOPENEX HFA) 45 MCG/ACT inhaler Inhale 1-2 puffs into the lungs every 4 (four) hours as needed.      . levalbuterol (XOPENEX) 0.63 MG/3ML nebulizer solution Take 1 ampule by nebulization every 6 (six) hours as needed. (unsure of dosage)       . losartan (COZAAR) 50 MG tablet TAKE ONE TABLET BY MOUTH EVERY DAY  30 tablet  PRN  . Multiple Vitamin (MULTIVITAMIN PO) Take  1 tablet by mouth daily.        . nitroGLYCERIN (NITROSTAT) 0.4 MG SL tablet Place 0.4 mg under the tongue every 5 (five) minutes as needed.        . pantoprazole (PROTONIX) 40 MG tablet Take 40 mg by mouth 2 (two) times daily.        . phenazopyridine (PYRIDIUM) 200 MG tablet Take 200 mg by mouth 3 (three) times daily as needed.      . polyethylene glycol (MIRALAX / GLYCOLAX) packet Take 17 g by mouth daily.      . rosuvastatin (CRESTOR) 20 MG tablet Take 20 mg by mouth daily.        Marland Kitchen saccharomyces boulardii (FLORASTOR) 250 MG capsule Take 250 mg by mouth 2 (two) times daily.        Marland Kitchen Spacer/Aero-Holding Chambers (AEROCHAMBER Z-STAT PLUS) inhaler Use as instructed  1 each  0  . terconazole (TERAZOL 7) 0.4 % vaginal cream       . tiotropium (SPIRIVA) 18 MCG inhalation capsule Place 18 mcg into inhaler and inhale daily.        Marland Kitchen triamcinolone (NASACORT) 55 MCG/ACT nasal inhaler 1 spray by Nasal route daily.       . valACYclovir (VALTREX) 500 MG tablet Take 500 mg by mouth daily.        . [DISCONTINUED] zolpidem (AMBIEN) 10 MG tablet Take 5 mg by  mouth at bedtime as needed.         No current facility-administered medications for this visit.    Allergies  Allergen Reactions  . Nitrofurantoin Monohyd Macro Shortness Of Breath  . Thorazine (Chlorpromazine Hcl)     Past Medical History  Diagnosis Date  . Renal cell cancer     (Lt) partial nephrectomy dx 11/06  . Emphysema   . Hypertension   . Chest pain   . Dyspnea   . CAD (coronary artery disease)   . Hyperlipidemia   . Headache   . Goiter   . COPD (chronic obstructive pulmonary disease)   . Asthma   . Allergic rhinitis     Past Surgical History  Procedure Laterality Date  . Cesarean section      x 3  . Nephrectomy      partial  . Uretheral re-implantation      Family History  Problem Relation Age of Onset  . Hypothyroidism Mother   . Heart disease Mother   . Asthma Mother   . Hypertension Mother   . Heart disease Father   . Heart attack Father   . Hypertension Father     History   Social History  . Marital Status: Married    Spouse Name: N/A    Number of Children: N/A  . Years of Education: N/A   Occupational History  . retired    Social History Main Topics  . Smoking status: Former Smoker -- 2.00 packs/day for 35 years    Types: Cigarettes    Quit date: 11/05/2000  . Smokeless tobacco: Never Used  . Alcohol Use: Yes     Comment: occ glass of red wine with steak  . Drug Use: No  . Sexually Active: Yes    Birth Control/ Protection: Post-menopausal   Other Topics Concern  . Not on file   Social History Narrative  . No narrative on file    ROS: Please see the HPI.  All other systems reviewed and negative.  PHYSICAL EXAM:  There were no vitals taken  for this visit.  General: Well developed, well nourished, in no acute distress. Head:  Normocephalic and atraumatic. Neck: no JVD Lungs: decrease bs throughout.  Some expir prolongation.   Heart: Normal S1 and paradoxical S2.  No def murmur.   Pulses: Pulses normal in all 4  extremities. Extremities:bilateral 1-2 LE edema, with R greater than left, no erythema, but some calf tenderness.   Neurologic: Alert and oriented x 3.  EKG:  NSR.  LBBB.  Compared to last tracing, loss of R wave in I, AVL.    ASSESSMENT AND PLAN:

## 2012-12-22 NOTE — Patient Instructions (Addendum)
Your physician recommends that you have lab work today: CBC  Your physician has requested that you have an echocardiogram. Echocardiography is a painless test that uses sound waves to create images of your heart. It provides your doctor with information about the size and shape of your heart and how well your heart's chambers and valves are working. This procedure takes approximately one hour. There are no restrictions for this procedure.  Your physician has requested that you have a lower extremity venous duplex (ASAP). This test is an ultrasound of the veins in the legs. It looks at venous blood flow that carries blood from the heart to the legs. Allow one hour for a Lower Venous exam. There are no restrictions or special instructions.  Your physician recommends that you continue on your current medications as directed. Please refer to the Current Medication list given to you today.  Your physician recommends that you schedule a follow-up appointment in: 4 WEEKS with Dr [name]

## 2012-12-23 ENCOUNTER — Encounter (HOSPITAL_COMMUNITY)
Admission: RE | Admit: 2012-12-23 | Discharge: 2012-12-23 | Disposition: A | Discharge status: Home / Self Care | Payer: BC Managed Care – PPO | Source: Ambulatory Visit | Attending: Pulmonary Disease | Admitting: Pulmonary Disease

## 2012-12-25 ENCOUNTER — Encounter (HOSPITAL_COMMUNITY)
Admission: RE | Admit: 2012-12-25 | Discharge: 2012-12-25 | Disposition: A | Discharge status: Home / Self Care | Payer: BC Managed Care – PPO | Source: Ambulatory Visit | Attending: Pulmonary Disease | Admitting: Pulmonary Disease

## 2012-12-28 DIAGNOSIS — R0600 Dyspnea, unspecified: Secondary | ICD-10-CM | POA: Insufficient documentation

## 2012-12-28 NOTE — Assessment & Plan Note (Addendum)
She has pretty bad shortness of breath, and a commensurate exam of COPD.  I suspect she had periods of pretty substantial hypoxemia driving some of this.  She noted this on the plane.  Fortunately she is back in pulm rehab where she is being watched regularly.  We will get another echo to reassess her status;  Also rule out DVT given the findings on exam suggesting some right heart overload.

## 2012-12-28 NOTE — Assessment & Plan Note (Signed)
On chronic home 02.

## 2012-12-28 NOTE — Assessment & Plan Note (Signed)
Her symptoms sound perhaps as much related to pulm driven issues, especially with her exam related findings.  See workup as outlined and we will see her back soon.  Fortunately, she seems some better.

## 2012-12-28 NOTE — Assessment & Plan Note (Signed)
Being followed by her primary and endocrinologist.

## 2012-12-30 ENCOUNTER — Encounter (HOSPITAL_COMMUNITY): Payer: BC Managed Care – PPO

## 2012-12-30 ENCOUNTER — Ambulatory Visit (HOSPITAL_COMMUNITY): Payer: BC Managed Care – PPO | Attending: Cardiology | Admitting: Radiology

## 2012-12-30 DIAGNOSIS — E785 Hyperlipidemia, unspecified: Secondary | ICD-10-CM | POA: Insufficient documentation

## 2012-12-30 DIAGNOSIS — I10 Essential (primary) hypertension: Secondary | ICD-10-CM

## 2012-12-30 DIAGNOSIS — J449 Chronic obstructive pulmonary disease, unspecified: Secondary | ICD-10-CM | POA: Insufficient documentation

## 2012-12-30 DIAGNOSIS — R0989 Other specified symptoms and signs involving the circulatory and respiratory systems: Secondary | ICD-10-CM | POA: Insufficient documentation

## 2012-12-30 DIAGNOSIS — J4489 Other specified chronic obstructive pulmonary disease: Secondary | ICD-10-CM | POA: Insufficient documentation

## 2012-12-30 DIAGNOSIS — R0609 Other forms of dyspnea: Secondary | ICD-10-CM | POA: Insufficient documentation

## 2012-12-30 DIAGNOSIS — I2510 Atherosclerotic heart disease of native coronary artery without angina pectoris: Secondary | ICD-10-CM | POA: Insufficient documentation

## 2012-12-30 DIAGNOSIS — I447 Left bundle-branch block, unspecified: Secondary | ICD-10-CM | POA: Insufficient documentation

## 2012-12-30 NOTE — Progress Notes (Signed)
Echocardiogram performed.  

## 2013-01-01 ENCOUNTER — Ambulatory Visit (INDEPENDENT_AMBULATORY_CARE_PROVIDER_SITE_OTHER): Payer: BC Managed Care – PPO | Admitting: Internal Medicine

## 2013-01-01 ENCOUNTER — Ambulatory Visit
Admission: RE | Admit: 2013-01-01 | Discharge: 2013-01-01 | Disposition: A | Discharge status: Home / Self Care | Payer: BC Managed Care – PPO | Source: Ambulatory Visit | Attending: Internal Medicine | Admitting: Internal Medicine

## 2013-01-01 ENCOUNTER — Encounter (HOSPITAL_COMMUNITY): Payer: BC Managed Care – PPO

## 2013-01-01 ENCOUNTER — Encounter: Payer: Self-pay | Admitting: Internal Medicine

## 2013-01-01 VITALS — BP 138/84 | HR 72 | Temp 98.7°F | Wt 160.0 lb

## 2013-01-01 DIAGNOSIS — Z9981 Dependence on supplemental oxygen: Secondary | ICD-10-CM

## 2013-01-01 DIAGNOSIS — Z8679 Personal history of other diseases of the circulatory system: Secondary | ICD-10-CM

## 2013-01-01 DIAGNOSIS — Z8709 Personal history of other diseases of the respiratory system: Secondary | ICD-10-CM

## 2013-01-01 DIAGNOSIS — M25579 Pain in unspecified ankle and joints of unspecified foot: Secondary | ICD-10-CM

## 2013-01-01 DIAGNOSIS — M25572 Pain in left ankle and joints of left foot: Secondary | ICD-10-CM

## 2013-01-02 NOTE — Patient Instructions (Addendum)
Keep feet elevated as much as possible. Use Ace wrap left lower extremity and foot. Apply ice to left lower extremity 20 minutes twice daily.

## 2013-01-02 NOTE — Progress Notes (Signed)
  Subjective:    Patient ID: [name], female    DOB: [birth year], 62 y.o.   MRN: [id number]  HPI 62 year old White female history of coronary artery disease and COPD on home oxygen in today complaining of swelling particularly left lower extremity. Patient and husband went on trip to [place] in November. Trip was very difficult. Air quality was poor and she had some issues with oxygen desaturation. Denies any fall or injury to left lower extremity but complaining of pain in left lower ankle and foot. Recently saw Dr. [name] for cardiology evaluation. He ordered a 2-D echocardiogram showing ejection fraction to be normal. She had some mild LVH. However, recent EKG raised question of possible anteroseptal MI. There has been a change in axis from -10 to 83 . CBC was drawn by Dr. [name] and was within normal limits. She also had  Doppler studies to rule out DVT in both lower extremities. The studies were negative according to patient. The studies were ordered by Dr. [name]. Patient continues to see urologist in [place], Dr. Malen Gauze. Dr. Malen Gauze is told her it is okay for her to take Pyridium on a regular basis. History of recurrent urinary tract infections. Also, status post surgery for kidney cancer.  Patient having some issues getting to cardiac rehabilitation. Walk from the parking lot is difficult for her. May need to use valet parking.    Review of Systems     Objective:   Physical Exam Patient has trace nonpitting edema of left lower extremity. Has tenderness along the lower tibia medial aspect. Tender along the third metatarsal. Chest clear to auscultation.        Assessment & Plan:  Possible stress fracture in foot  Possible shin splint left lower extremity  Mild dependent edema  Coronary artery disease  COPD  History of kidney cancer  History of recurrent urinary tract infections  Plan: X-ray of the tib-fib and foot. Recommend keeping feet elevated. Apply ice  to lower leg and foot. X-ray reveals possible stress reaction left fibula. Recommend ace wrap and keeping left leg elevated as much as possible. Consider orthopedic consultation if symptoms persist.  25 minutes spent with patient discussing multiple medical issues and addressing pain in left lower extremity and foot.

## 2013-01-05 ENCOUNTER — Telehealth (HOSPITAL_COMMUNITY): Payer: Self-pay | Admitting: *Deleted

## 2013-01-06 ENCOUNTER — Telehealth: Payer: Self-pay | Admitting: *Deleted

## 2013-01-06 ENCOUNTER — Encounter (HOSPITAL_COMMUNITY): Payer: BC Managed Care – PPO | Attending: Pulmonary Disease

## 2013-01-06 ENCOUNTER — Encounter: Payer: Self-pay | Admitting: Pulmonary Disease

## 2013-01-06 ENCOUNTER — Ambulatory Visit (INDEPENDENT_AMBULATORY_CARE_PROVIDER_SITE_OTHER): Payer: BC Managed Care – PPO | Admitting: Pulmonary Disease

## 2013-01-06 VITALS — BP 130/80 | HR 71 | Temp 97.6°F | Ht 63.0 in | Wt 160.4 lb

## 2013-01-06 DIAGNOSIS — E042 Nontoxic multinodular goiter: Secondary | ICD-10-CM | POA: Insufficient documentation

## 2013-01-06 DIAGNOSIS — I447 Left bundle-branch block, unspecified: Secondary | ICD-10-CM | POA: Insufficient documentation

## 2013-01-06 DIAGNOSIS — R0600 Dyspnea, unspecified: Secondary | ICD-10-CM

## 2013-01-06 DIAGNOSIS — E785 Hyperlipidemia, unspecified: Secondary | ICD-10-CM | POA: Insufficient documentation

## 2013-01-06 DIAGNOSIS — Z5189 Encounter for other specified aftercare: Secondary | ICD-10-CM | POA: Insufficient documentation

## 2013-01-06 DIAGNOSIS — J4489 Other specified chronic obstructive pulmonary disease: Secondary | ICD-10-CM | POA: Insufficient documentation

## 2013-01-06 DIAGNOSIS — J449 Chronic obstructive pulmonary disease, unspecified: Secondary | ICD-10-CM

## 2013-01-06 DIAGNOSIS — I2510 Atherosclerotic heart disease of native coronary artery without angina pectoris: Secondary | ICD-10-CM | POA: Insufficient documentation

## 2013-01-06 DIAGNOSIS — R0989 Other specified symptoms and signs involving the circulatory and respiratory systems: Secondary | ICD-10-CM

## 2013-01-06 DIAGNOSIS — I10 Essential (primary) hypertension: Secondary | ICD-10-CM | POA: Insufficient documentation

## 2013-01-06 DIAGNOSIS — R0609 Other forms of dyspnea: Secondary | ICD-10-CM

## 2013-01-06 MED ORDER — METHYLPREDNISOLONE 16 MG PO TABS: 16.0000 mg | ORAL_TABLET | Freq: Every day | ORAL | Status: DC

## 2013-01-06 NOTE — Progress Notes (Signed)
  Subjective:    Patient ID: [name], female    DOB: 1951-06-16, 62 y.o.   MRN: [id number]  HPI Patient comes in today for followup of her known COPD with chronic respiratory failure.  Since last visit, she has had increasing shortness of breath and significant fatigue over the last few months.  She has been on a very taxing vacation, but has never returned to her baseline.  She complains of dyspnea on exertion over her usual baseline, but denies any chest congestion, cough, or purulent mucus.  She has had some lower extremity edema, but her lower extremity Dopplers were unremarkable.  She has also had a recent echo that was unremarkable.  She has not had any pleuritic chest pain.   Review of Systems  Constitutional: Negative for fever and unexpected weight change.  HENT: Negative for ear pain, nosebleeds, congestion, sore throat, rhinorrhea, sneezing, trouble swallowing, dental problem, postnasal drip and sinus pressure.   Eyes: Negative for redness and itching.  Respiratory: Positive for cough and shortness of breath. Negative for chest tightness and wheezing.   Cardiovascular: Positive for leg swelling (Feet swelling since November). Negative for palpitations.  Gastrointestinal: Negative for nausea and vomiting.  Genitourinary: Negative for dysuria.  Musculoskeletal: Negative for joint swelling.  Skin: Negative for rash.  Neurological: Negative for headaches.  Hematological: Does not bruise/bleed easily.  Psychiatric/Behavioral: Negative for dysphoric mood. The patient is not nervous/anxious.        Objective:   Physical Exam Overweight female in no acute distress Nose without purulence or discharge noted Neck without lymphadenopathy or thyromegaly Chest with mildly decreased breath sounds, no wheezes or crackles Cardiac exam with regular rate and rhythm Lower extremities with mild edema, no cyanosis Alert and oriented, moves all 4 extremities.       Assessment & Plan:

## 2013-01-06 NOTE — Addendum Note (Signed)
Addended by: Orma Flaming D on: 01/06/2013 02:42 PM   Modules accepted: Orders

## 2013-01-06 NOTE — Patient Instructions (Addendum)
Will treat you with a short course of medrol to see if your symptoms improve.  If they do not, would consider a trial of increased diuretic. Be sure and keep your cardiac followup with Dr. [name]. No change in maintenance breathing medications. followup with me in 6mos if doing well.

## 2013-01-06 NOTE — Assessment & Plan Note (Signed)
The pt has a h/o moderate to severe copd, and has feels her breathing has not been at baseline for the last few months.  She is more sob, and feels very fatigued.  She does not have any wheezing on exam today, and has adequate air movement. It is unclear whether this has anything to do with her COPD, or whether this may be something cardiac in origin.  Her recent echo looks fairly normal, and she has had lower extremity Dopplers which showed no DVT.  Her weight is completely stable from the last visit, and therefore I doubt she is volume overloaded.  I think it is reasonable to give her a short burst of steroids to see if this resolves her symptoms quickly.  If it does not, then I would consider a trial of increased diuresis.  If this does not improve things, she may need more aggressive cardiac evaluation.

## 2013-01-06 NOTE — Telephone Encounter (Signed)
Letter pertaining to pts Pulm Rehab d/c has been placed in your green folder for review and FYI.

## 2013-01-08 ENCOUNTER — Encounter (HOSPITAL_COMMUNITY): Payer: BC Managed Care – PPO

## 2013-01-13 ENCOUNTER — Encounter (HOSPITAL_COMMUNITY): Admission: RE | Admit: 2013-01-13 | Payer: BC Managed Care – PPO | Source: Ambulatory Visit

## 2013-01-15 ENCOUNTER — Encounter (HOSPITAL_COMMUNITY): Payer: BC Managed Care – PPO

## 2013-01-20 ENCOUNTER — Other Ambulatory Visit: Payer: Self-pay | Admitting: Internal Medicine

## 2013-01-20 ENCOUNTER — Encounter (HOSPITAL_COMMUNITY): Payer: BC Managed Care – PPO

## 2013-01-22 ENCOUNTER — Encounter (HOSPITAL_COMMUNITY): Payer: BC Managed Care – PPO

## 2013-01-26 ENCOUNTER — Ambulatory Visit (INDEPENDENT_AMBULATORY_CARE_PROVIDER_SITE_OTHER): Payer: BC Managed Care – PPO | Admitting: Cardiology

## 2013-01-26 ENCOUNTER — Encounter: Payer: Self-pay | Admitting: Cardiology

## 2013-01-26 VITALS — BP 138/84 | HR 73 | Ht 63.0 in | Wt 160.0 lb

## 2013-01-26 DIAGNOSIS — R0989 Other specified symptoms and signs involving the circulatory and respiratory systems: Secondary | ICD-10-CM

## 2013-01-26 DIAGNOSIS — R0609 Other forms of dyspnea: Secondary | ICD-10-CM

## 2013-01-26 DIAGNOSIS — I2510 Atherosclerotic heart disease of native coronary artery without angina pectoris: Secondary | ICD-10-CM

## 2013-01-26 DIAGNOSIS — I10 Essential (primary) hypertension: Secondary | ICD-10-CM

## 2013-01-26 DIAGNOSIS — R0600 Dyspnea, unspecified: Secondary | ICD-10-CM

## 2013-01-26 DIAGNOSIS — E785 Hyperlipidemia, unspecified: Secondary | ICD-10-CM

## 2013-01-26 NOTE — Patient Instructions (Signed)
Follow up with Dr.Cooper in 2 months.

## 2013-01-26 NOTE — Progress Notes (Signed)
HPI:  She returns in followup. She's been placed on higher dose steroids, and this may have helped a little bit. She got worse when she went on a long trip, and the winter months been particularly difficult for her.  Her exercise tolerance seems to have diminished some.   Current Outpatient Prescriptions  Medication Sig Dispense Refill  . ascorbic acid (VITAMIN C) 1000 MG tablet Take 1,000 mg by mouth daily.      Marland Kitchen aspirin 325 MG tablet Take 325 mg by mouth daily.        Marland Kitchen atenolol (TENORMIN) 25 MG tablet TAKE ONE TABLET BY MOUTH EVERY DAY  90 tablet  PRN  . butalbital-aspirin-caffeine (FIORINAL) 50-325-40 MG per capsule TAKE ONE TO TWO CAPSULES BY MOUTH EVERY 6 HOURS AS NEEDED FOR HEADACHE. DO NOT EXCEED 6 CAPSULES IN 24 HOURS  30 capsule  5  . cetirizine (ZYRTEC) 10 MG tablet Take 10 mg by mouth daily.       . Coenzyme Q10 (COQ10) 100 MG CAPS Take 1 tablet by mouth daily.        . cromolyn (NASALCROM) 5.2 MG/ACT nasal spray Place 1 spray into the nose 2 (two) times daily.      . diphenhydrAMINE (BENADRYL) 25 MG tablet Take 25 mg by mouth every 6 (six) hours as needed. 25 or 50 mg as needed      . DULERA 100-5 MCG/ACT AERO INHALE TWO PUFFS BY MOUTH TWICE DAILY  13 g  11  . estradiol (VAGIFEM) 25 MCG vaginal tablet Insert one vaginally every 3 days       . fish oil-omega-3 fatty acids 1000 MG capsule Take 1 capsule by mouth daily.        . GuaiFENesin (MUCINEX PO) Take by mouth as needed.        . hydrochlorothiazide (HYDRODIURIL) 25 MG tablet TAKE ONE TABLET BY MOUTH EVERY DAY  90 tablet  PRN  . losartan (COZAAR) 50 MG tablet TAKE ONE TABLET BY MOUTH EVERY DAY  30 tablet  PRN  . Multiple Vitamins-Minerals (CENTRUM SILVER PO) Take one daily      . nitroGLYCERIN (NITROSTAT) 0.4 MG SL tablet Place 0.4 mg under the tongue every 5 (five) minutes as needed.        . pantoprazole (PROTONIX) 40 MG tablet Take 40 mg by mouth 2 (two) times daily.        . phenazopyridine (PYRIDIUM) 200 MG tablet Take  200 mg by mouth 3 (three) times daily as needed.      . polyethylene glycol (MIRALAX / GLYCOLAX) packet Take 17 g by mouth daily.      . promethazine (PHENERGAN) 25 MG tablet Take 25 mg by mouth every 6 (six) hours as needed for nausea.      . rosuvastatin (CRESTOR) 20 MG tablet Take 20 mg by mouth daily.        Marland Kitchen saccharomyces boulardii (FLORASTOR) 250 MG capsule Take 250 mg by mouth 2 (two) times daily.        Marland Kitchen tiotropium (SPIRIVA) 18 MCG inhalation capsule Place 18 mcg into inhaler and inhale daily.        . valACYclovir (VALTREX) 500 MG tablet TAKE ONE TABLET BY MOUTH EVERY DAY  90 tablet  0  . Zinc 100 MG TABS Take one tablet daily      . [DISCONTINUED] zolpidem (AMBIEN) 10 MG tablet Take 5 mg by mouth at bedtime as needed.  No current facility-administered medications for this visit.    Allergies  Allergen Reactions  . Nitrofurantoin Monohyd Macro Shortness Of Breath  . Thorazine (Chlorpromazine Hcl)     Past Medical History  Diagnosis Date  . Renal cell cancer     (Lt) partial nephrectomy dx 11/06  . Emphysema   . Hypertension   . Chest pain   . Dyspnea   . CAD (coronary artery disease)   . Hyperlipidemia   . Headache   . Goiter   . COPD (chronic obstructive pulmonary disease)   . Asthma   . Allergic rhinitis     Past Surgical History  Procedure Laterality Date  . Cesarean section      x 3  . Nephrectomy      partial  . Uretheral re-implantation      Family History  Problem Relation Age of Onset  . Hypothyroidism Mother   . Heart disease Mother   . Asthma Mother   . Hypertension Mother   . Heart disease Father   . Heart attack Father   . Hypertension Father     History   Social History  . Marital Status: Married    Spouse Name: N/A    Number of Children: N/A  . Years of Education: N/A   Occupational History  . retired    Social History Main Topics  . Smoking status: Former Smoker -- 2.00 packs/day for 35 years    Types: Cigarettes     Quit date: 11/05/2000  . Smokeless tobacco: Never Used  . Alcohol Use: Yes     Comment: occ glass of red wine with steak  . Drug Use: No  . Sexually Active: Yes    Birth Control/ Protection: Post-menopausal   Other Topics Concern  . Not on file   Social History Narrative  . No narrative on file    ROS: Please see the HPI.  All other systems reviewed and negative.  PHYSICAL EXAM:  BP 138/84  Pulse 73  Ht 5\' 3"  (1.6 m)  Wt 160 lb (72.576 kg)  BMI 28.35 kg/m2  SpO2 99%  General: Well developed, well nourished, in no acute distress. Head:  Normocephalic and atraumatic. Neck: no JVD Lungs:decrease BS throughout.  02 is in placed.   Heart: Normal S1 and S2.  No murmur, rubs or gallops.  Pulses: Pulses normal in all 4 extremities. Extremities: No clubbing or cyanosis. Trace edema in the left foot only, but not dramatic.   Neurologic: Alert and oriented x 3.  EKG:  Not done.  Prior iLBBB.    ASSESSMENT AND PLAN:  1.  RTC 2 months to see Dr Excell Seltzer 2.  Low threshold for cath if symptoms not much better.

## 2013-01-27 ENCOUNTER — Encounter (HOSPITAL_COMMUNITY): Payer: BC Managed Care – PPO

## 2013-01-27 NOTE — Assessment & Plan Note (Addendum)
See under CAD.  Note that 02 sats were pretty low on her trip.

## 2013-01-27 NOTE — Assessment & Plan Note (Signed)
Currently controlled.

## 2013-01-27 NOTE — Assessment & Plan Note (Signed)
Managed by Dr. [name]

## 2013-01-27 NOTE — Assessment & Plan Note (Signed)
See note of Dr. Shelle Iron and would agree.  Perhaps a little bit better.  Will continue to observe.  Her condition seemed to get worse on her overseas trip in the setting of air pollution.  She has not fully recovered from the taxing trip.  No current chest pain.  If she does not get better, then cath might be considered going forward.

## 2013-01-29 ENCOUNTER — Encounter (HOSPITAL_COMMUNITY): Payer: BC Managed Care – PPO

## 2013-02-03 ENCOUNTER — Encounter (HOSPITAL_COMMUNITY): Payer: BC Managed Care – PPO | Attending: Pulmonary Disease

## 2013-02-03 DIAGNOSIS — E785 Hyperlipidemia, unspecified: Secondary | ICD-10-CM | POA: Insufficient documentation

## 2013-02-03 DIAGNOSIS — Z5189 Encounter for other specified aftercare: Secondary | ICD-10-CM | POA: Insufficient documentation

## 2013-02-03 DIAGNOSIS — I447 Left bundle-branch block, unspecified: Secondary | ICD-10-CM | POA: Insufficient documentation

## 2013-02-03 DIAGNOSIS — I2510 Atherosclerotic heart disease of native coronary artery without angina pectoris: Secondary | ICD-10-CM | POA: Insufficient documentation

## 2013-02-03 DIAGNOSIS — E042 Nontoxic multinodular goiter: Secondary | ICD-10-CM | POA: Insufficient documentation

## 2013-02-03 DIAGNOSIS — J449 Chronic obstructive pulmonary disease, unspecified: Secondary | ICD-10-CM | POA: Insufficient documentation

## 2013-02-03 DIAGNOSIS — J4489 Other specified chronic obstructive pulmonary disease: Secondary | ICD-10-CM | POA: Insufficient documentation

## 2013-02-03 DIAGNOSIS — I10 Essential (primary) hypertension: Secondary | ICD-10-CM | POA: Insufficient documentation

## 2013-02-05 ENCOUNTER — Encounter (HOSPITAL_COMMUNITY): Payer: BC Managed Care – PPO

## 2013-02-07 IMAGING — CR DG CHEST 2V
2 series · 2 of 2 positions shown · non-contrast
Comparison: 05/15/2007

CLINICAL DATA: Fever.  Cough.

CHEST - 2 VIEW

[w chest pa]
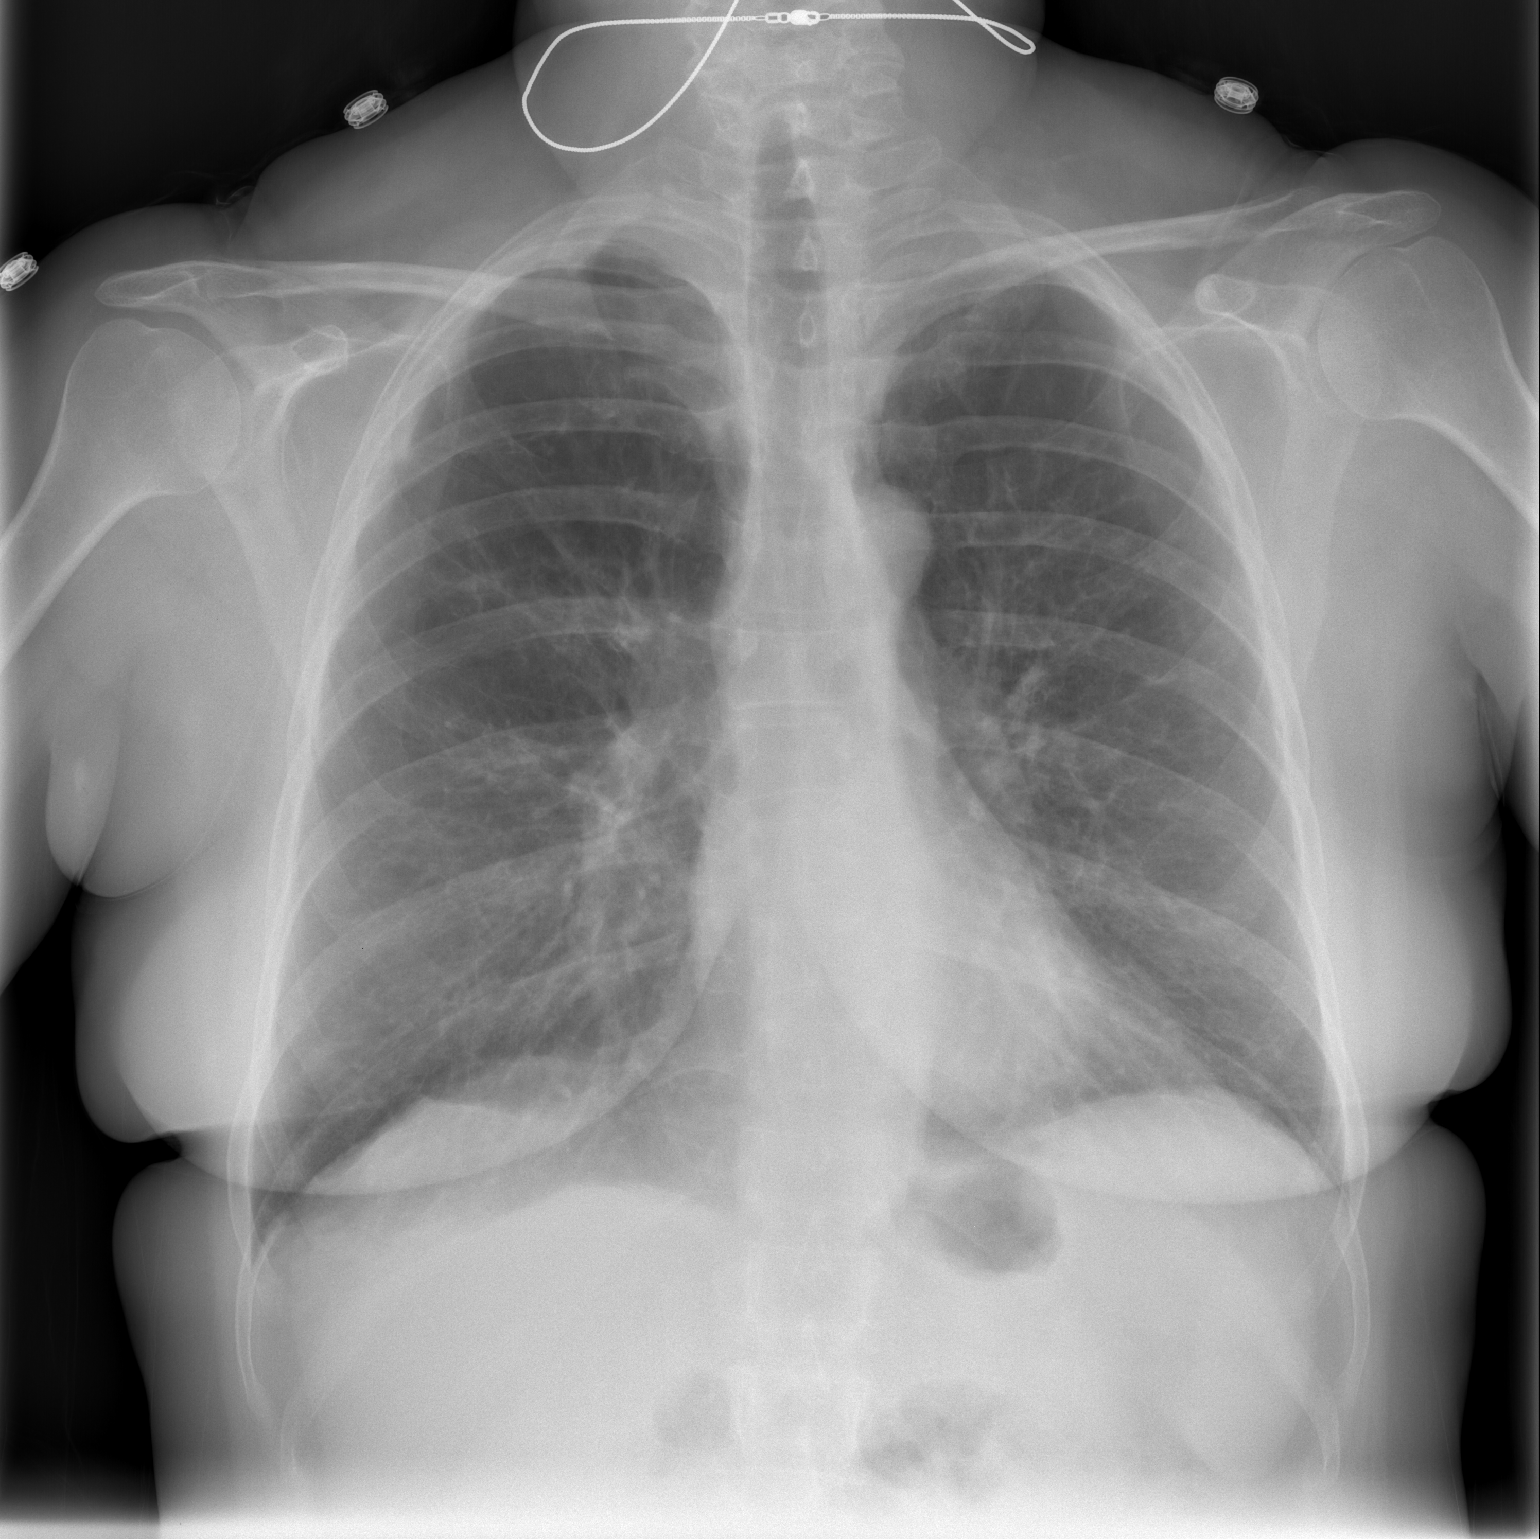

[w chest lat]
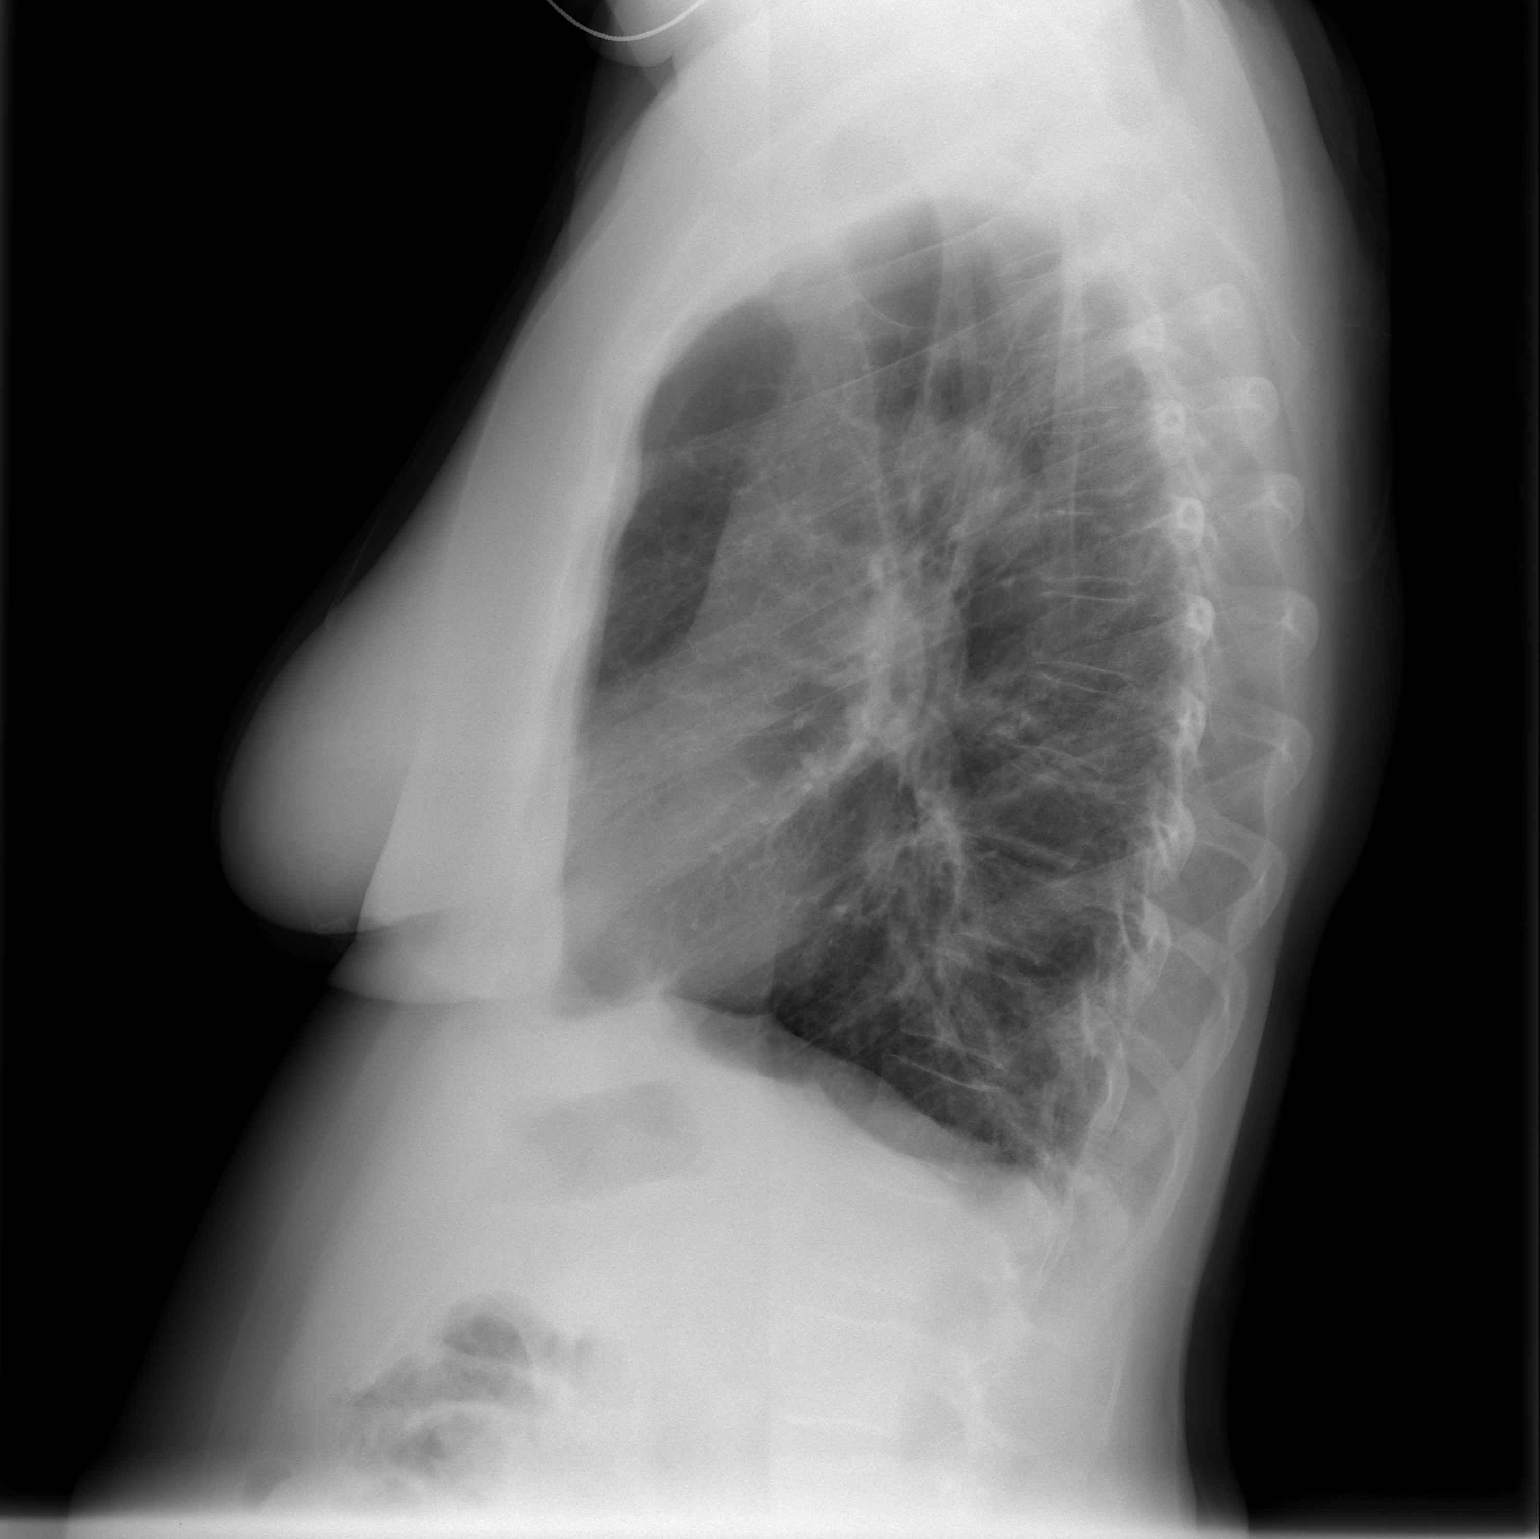

[2 of 2 positions shown; findings below may reference images not displayed]

FINDINGS: Heart size is normal.  Mediastinal shadows are normal.
There is bronchial thickening.  No infiltrate, collapse or
effusion.  No significant bony finding.
IMPRESSION: Bronchitis.  No consolidation or collapse.

## 2013-02-10 ENCOUNTER — Encounter (HOSPITAL_COMMUNITY): Payer: BC Managed Care – PPO

## 2013-02-12 ENCOUNTER — Encounter (HOSPITAL_COMMUNITY): Payer: BC Managed Care – PPO

## 2013-02-17 ENCOUNTER — Encounter (HOSPITAL_COMMUNITY): Payer: BC Managed Care – PPO

## 2013-02-19 ENCOUNTER — Encounter (HOSPITAL_COMMUNITY): Payer: BC Managed Care – PPO

## 2013-02-21 ENCOUNTER — Other Ambulatory Visit: Payer: Self-pay | Admitting: Internal Medicine

## 2013-02-24 ENCOUNTER — Encounter (HOSPITAL_COMMUNITY): Payer: BC Managed Care – PPO

## 2013-02-26 ENCOUNTER — Encounter (HOSPITAL_COMMUNITY): Payer: BC Managed Care – PPO

## 2013-03-03 ENCOUNTER — Encounter (HOSPITAL_COMMUNITY): Payer: BC Managed Care – PPO

## 2013-03-05 ENCOUNTER — Encounter (HOSPITAL_COMMUNITY): Payer: BC Managed Care – PPO

## 2013-03-06 ENCOUNTER — Other Ambulatory Visit: Payer: Self-pay | Admitting: Internal Medicine

## 2013-03-10 ENCOUNTER — Encounter (HOSPITAL_COMMUNITY): Payer: BC Managed Care – PPO

## 2013-03-16 ENCOUNTER — Ambulatory Visit (INDEPENDENT_AMBULATORY_CARE_PROVIDER_SITE_OTHER): Payer: BC Managed Care – PPO | Admitting: Internal Medicine

## 2013-03-16 ENCOUNTER — Encounter: Payer: Self-pay | Admitting: Internal Medicine

## 2013-03-16 VITALS — BP 140/84 | Temp 98.8°F | Wt 161.0 lb

## 2013-03-16 DIAGNOSIS — Z9981 Dependence on supplemental oxygen: Secondary | ICD-10-CM

## 2013-03-16 DIAGNOSIS — I10 Essential (primary) hypertension: Secondary | ICD-10-CM

## 2013-03-16 DIAGNOSIS — Z87440 Personal history of urinary (tract) infections: Secondary | ICD-10-CM

## 2013-03-16 DIAGNOSIS — I519 Heart disease, unspecified: Secondary | ICD-10-CM

## 2013-03-16 DIAGNOSIS — E785 Hyperlipidemia, unspecified: Secondary | ICD-10-CM

## 2013-03-16 DIAGNOSIS — Z8709 Personal history of other diseases of the respiratory system: Secondary | ICD-10-CM

## 2013-03-16 NOTE — Patient Instructions (Addendum)
Continue same meds and return as needed

## 2013-03-25 ENCOUNTER — Ambulatory Visit: Payer: BC Managed Care – PPO | Admitting: Cardiovascular Disease

## 2013-03-27 ENCOUNTER — Ambulatory Visit (INDEPENDENT_AMBULATORY_CARE_PROVIDER_SITE_OTHER): Payer: BC Managed Care – PPO | Admitting: Cardiovascular Disease

## 2013-03-27 ENCOUNTER — Encounter: Payer: Self-pay | Admitting: Cardiovascular Disease

## 2013-03-27 VITALS — BP 141/83 | HR 76 | Ht 63.0 in | Wt 162.0 lb

## 2013-03-27 DIAGNOSIS — N952 Postmenopausal atrophic vaginitis: Secondary | ICD-10-CM | POA: Insufficient documentation

## 2013-03-27 DIAGNOSIS — I2510 Atherosclerotic heart disease of native coronary artery without angina pectoris: Secondary | ICD-10-CM

## 2013-03-27 DIAGNOSIS — C649 Malignant neoplasm of unspecified kidney, except renal pelvis: Secondary | ICD-10-CM | POA: Insufficient documentation

## 2013-03-27 DIAGNOSIS — B3731 Acute candidiasis of vulva and vagina: Secondary | ICD-10-CM | POA: Insufficient documentation

## 2013-03-27 DIAGNOSIS — B373 Candidiasis of vulva and vagina: Secondary | ICD-10-CM | POA: Insufficient documentation

## 2013-03-27 DIAGNOSIS — N281 Cyst of kidney, acquired: Secondary | ICD-10-CM | POA: Insufficient documentation

## 2013-03-27 NOTE — Patient Instructions (Addendum)
Your physician wants you to follow-up in: 6 MONTHS with Dr Cooper.  You will receive a reminder letter in the mail two months in advance. If you don't receive a letter, please call our office to schedule the follow-up appointment.  Your physician recommends that you continue on your current medications as directed. Please refer to the Current Medication list given to you today.  

## 2013-03-27 NOTE — Progress Notes (Signed)
HPI:   62 year old woman presenting for followup evaluation. She's been followed by Dr. [name]. She has nonobstructive coronary artery disease. Her last cardiac catheterization was in 2008. This demonstrated mild plaque in the mid LAD and mild to moderate stenosis in the left circumflex distribution. She's had normal left ventricular systolic function. She just had an echo in February of this year demonstrating mild LVH with normal LV systolic function and no significant valvular disease.  The patient complains of slowly progressive dyspnea with exertion. She is comfortable at rest and often times does not wear her oxygen when she is at home and resting comfortably. She denies orthopnea, PND, lightheadedness, or palpitations. She has occasional chest pain and feels like her heart is beating hard when she becomes extremely winded.  Outpatient Encounter Prescriptions as of 03/27/2013  Medication Sig Dispense Refill  . ascorbic acid (VITAMIN C) 1000 MG tablet Take 1,000 mg by mouth daily.      Marland Kitchen aspirin 325 MG tablet Take 325 mg by mouth daily.        Marland Kitchen atenolol (TENORMIN) 25 MG tablet TAKE ONE TABLET BY MOUTH EVERY DAY  90 tablet  PRN  . butalbital-aspirin-caffeine (FIORINAL) 50-325-40 MG per capsule TAKE ONE TO TWO CAPSULES BY MOUTH EVERY 6 HOURS AS NEEDED FOR HEADACHE. DO NOT EXCEED 6 CAPSULES IN 24 HOURS  30 capsule  5  . cetirizine (ZYRTEC) 10 MG tablet Take 10 mg by mouth daily.       . Coenzyme Q10 (COQ10) 100 MG CAPS Take 1 tablet by mouth daily.        . CRESTOR 20 MG tablet TAKE ONE TABLET BY MOUTH EVERY DAY  90 tablet  0  . cromolyn (NASALCROM) 5.2 MG/ACT nasal spray Place 1 spray into the nose 2 (two) times daily.      . diphenhydrAMINE (BENADRYL) 25 MG tablet Take 25 mg by mouth every 6 (six) hours as needed. 25 or 50 mg as needed      . DULERA 100-5 MCG/ACT AERO INHALE TWO PUFFS BY MOUTH TWICE DAILY  13 g  5  . estradiol (VAGIFEM) 25 MCG vaginal tablet Insert one vaginally every 3 days        . fish oil-omega-3 fatty acids 1000 MG capsule Take 1 capsule by mouth daily.        . GuaiFENesin (MUCINEX PO) Take by mouth as needed.        . hydrochlorothiazide (HYDRODIURIL) 25 MG tablet TAKE ONE TABLET BY MOUTH EVERY DAY  90 tablet  PRN  . losartan (COZAAR) 50 MG tablet TAKE ONE TABLET BY MOUTH EVERY DAY  30 tablet  PRN  . Multiple Vitamins-Minerals (CENTRUM SILVER PO) Take one daily      . nitroGLYCERIN (NITROSTAT) 0.4 MG SL tablet Place 0.4 mg under the tongue every 5 (five) minutes as needed.        . pantoprazole (PROTONIX) 40 MG tablet TAKE ONE TABLET BY MOUTH TWICE DAILY  180 tablet  3  . phenazopyridine (PYRIDIUM) 200 MG tablet Take 200 mg by mouth 3 (three) times daily as needed.      . polyethylene glycol (MIRALAX / GLYCOLAX) packet Take 17 g by mouth daily.      . promethazine (PHENERGAN) 25 MG tablet Take 25 mg by mouth every 6 (six) hours as needed for nausea.      Marland Kitchen saccharomyces boulardii (FLORASTOR) 250 MG capsule Take 250 mg by mouth 2 (two) times daily.        Marland Kitchen  tiotropium (SPIRIVA) 18 MCG inhalation capsule Place 18 mcg into inhaler and inhale daily.        . valACYclovir (VALTREX) 500 MG tablet TAKE ONE TABLET BY MOUTH EVERY DAY  90 tablet  0  . Zinc 100 MG TABS Take one tablet daily       No facility-administered encounter medications on file as of 03/27/2013.    Allergies  Allergen Reactions  . Nitrofurantoin Monohyd Macro Shortness Of Breath  . Thorazine (Chlorpromazine Hcl)     Past Medical History  Diagnosis Date  . Renal cell cancer     (Lt) partial nephrectomy dx 11/06  . Emphysema   . Hypertension   . Chest pain   . Dyspnea   . CAD (coronary artery disease)   . Hyperlipidemia   . Headache   . Goiter   . COPD (chronic obstructive pulmonary disease)   . Asthma   . Allergic rhinitis     ROS: Negative except as per HPI  BP 141/83  Pulse 76  Ht 5\' 3"  (1.6 m)  Wt 73.483 kg (162 lb)  BMI 28.7 kg/m2  PHYSICAL EXAM: Pt is alert and  oriented, NAD HEENT: normal Neck: JVP - normal, carotids 2+= without bruits Lungs: CTA bilaterally but with diminished air movement CV: RRR without murmur or gallop Abd: soft, NT, Positive BS, no hepatomegaly Ext: no C/C/E, distal pulses intact and equal Skin: warm/dry no rash  EKG:  Not performed today. Old EKGs show left bundle branch block.  2-D echo: Left ventricle: The cavity size was normal. Wall thickness was increased in a pattern of mild LVH. Systolic function was normal. The estimated ejection fraction was in the range of 55% to 60%. Wall motion was normal; there were no regional wall motion abnormalities. Doppler parameters are consistent with abnormal left ventricular relaxation (grade 1 diastolic dysfunction).  ------------------------------------------------------------ Aortic valve: Trileaflet; normal thickness leaflets. Mobility was not restricted. Doppler: Transvalvular velocity was within the normal range. There was no stenosis. No regurgitation.  ------------------------------------------------------------ Aorta: Aortic root: The aortic root was normal in size.  ------------------------------------------------------------ Mitral valve: Structurally normal valve. Mobility was not restricted. Doppler: Transvalvular velocity was within the normal range. There was no evidence for stenosis. No regurgitation. Peak gradient: 4mm Hg (D).  ------------------------------------------------------------ Left atrium: The atrium was normal in size.  ------------------------------------------------------------ Right ventricle: The cavity size was normal. Systolic function was normal.  ------------------------------------------------------------ Pulmonic valve: Doppler: Transvalvular velocity was within the normal range. There was no evidence for stenosis.  ------------------------------------------------------------ Tricuspid valve: Structurally normal valve.  Doppler: Transvalvular velocity was within the normal range. No regurgitation.  ------------------------------------------------------------ Right atrium: The atrium was normal in size.  ------------------------------------------------------------ Pericardium: There was no pericardial effusion.  ------------------------------------------------------------ Systemic veins: Inferior vena cava: The vessel was normal in size.  ASSESSMENT AND PLAN: 1. Chronic dyspnea with exertion. Suspect this is primarily related to her lung disease. We discussed consideration of a dobutamine stress echo, but she is not inclined to have a stress test at this point. I'll see her back in 6 months.  2. Coronary artery disease, nonobstructive. Continue medical management.  3. Hypertension. Blood pressure is controlled on combination of atenolol, hydrochlorothiazide, and losartan.  4. Hyperlipidemia. Followed by Dr. [name]. She's treated with Crestor 20 mg daily.  Tonny Bollman 03/27/2013 11:45 AM

## 2013-04-05 NOTE — Progress Notes (Signed)
  Subjective:    Patient ID: [name], female    DOB: [birth year], 62 y.o.   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 She has portable oxygen that she uses all the time for COPD. She's followed by Dr. Shelle Iron. She also has recently seen Dr. [name] for coronary artery disease followup. Trip to [place] was difficult. Smog was evident and bothered her breathing. She and her husband have been to [place] previously and enjoyed it very much. [place]. She needs a few prescriptions refilled before the trip.    Review of Systems     Objective:   Physical Exam neck is supple without JVD thyromegaly or carotid bruits. Chest clear to auscultation. Cardiac exam regular rate and rhythm normal S1 and S2. Trace lower extremity edema.        Assessment & Plan:  Forms for travel completed including portable oxygen orders  COPD  History of renal cell carcinoma of kidney  Coronary artery disease  Hyperlipidemia  Recurrent urinary tract infections

## 2013-04-06 ENCOUNTER — Other Ambulatory Visit: Payer: Self-pay | Admitting: Internal Medicine

## 2013-04-06 ENCOUNTER — Other Ambulatory Visit: Payer: Self-pay

## 2013-04-29 ENCOUNTER — Other Ambulatory Visit: Payer: Self-pay | Admitting: Internal Medicine

## 2013-05-27 ENCOUNTER — Other Ambulatory Visit: Payer: Self-pay | Admitting: Internal Medicine

## 2013-06-29 ENCOUNTER — Telehealth: Payer: Self-pay | Admitting: Internal Medicine

## 2013-06-29 NOTE — Telephone Encounter (Signed)
Written Rx with one year refills mailed to patient

## 2013-06-29 NOTE — Telephone Encounter (Signed)
Scripts written and mailed to patient today, 06/29/13.

## 2013-08-19 ENCOUNTER — Ambulatory Visit (INDEPENDENT_AMBULATORY_CARE_PROVIDER_SITE_OTHER): Payer: BC Managed Care – PPO | Admitting: Internal Medicine

## 2013-08-19 DIAGNOSIS — Z23 Encounter for immunization: Secondary | ICD-10-CM

## 2013-09-08 ENCOUNTER — Other Ambulatory Visit: Payer: Self-pay | Admitting: Internal Medicine

## 2013-09-08 NOTE — Telephone Encounter (Signed)
Call in #[phone] with no refill 

## 2013-09-19 ENCOUNTER — Other Ambulatory Visit: Payer: Self-pay | Admitting: Internal Medicine

## 2013-09-22 ENCOUNTER — Ambulatory Visit (INDEPENDENT_AMBULATORY_CARE_PROVIDER_SITE_OTHER): Payer: BC Managed Care – PPO | Admitting: Cardiovascular Disease

## 2013-09-22 ENCOUNTER — Encounter: Payer: Self-pay | Admitting: Cardiovascular Disease

## 2013-09-22 VITALS — BP 110/64 | HR 76 | Ht 63.0 in | Wt 153.0 lb

## 2013-09-22 DIAGNOSIS — E785 Hyperlipidemia, unspecified: Secondary | ICD-10-CM

## 2013-09-22 DIAGNOSIS — I10 Essential (primary) hypertension: Secondary | ICD-10-CM

## 2013-09-22 DIAGNOSIS — I2510 Atherosclerotic heart disease of native coronary artery without angina pectoris: Secondary | ICD-10-CM

## 2013-09-22 NOTE — Patient Instructions (Signed)
Your physician wants you to follow-up in: 1 YEAR with Dr Cooper.  You will receive a reminder letter in the mail two months in advance. If you don't receive a letter, please call our office to schedule the follow-up appointment.  Your physician recommends that you continue on your current medications as directed. Please refer to the Current Medication list given to you today.  

## 2013-09-23 ENCOUNTER — Other Ambulatory Visit: Payer: Self-pay | Admitting: *Deleted

## 2013-09-23 DIAGNOSIS — Z130 Encounter for screening for diseases of the blood and blood-forming organs and certain disorders involving the immune mechanism: Secondary | ICD-10-CM

## 2013-09-23 DIAGNOSIS — E785 Hyperlipidemia, unspecified: Secondary | ICD-10-CM

## 2013-09-23 DIAGNOSIS — I10 Essential (primary) hypertension: Secondary | ICD-10-CM

## 2013-09-23 DIAGNOSIS — Z1329 Encounter for screening for other suspected endocrine disorder: Secondary | ICD-10-CM

## 2013-09-24 ENCOUNTER — Encounter: Payer: Self-pay | Admitting: Cardiovascular Disease

## 2013-09-24 NOTE — Progress Notes (Signed)
HPI:  62 year old woman presenting for followup of nonobstructive coronary artery disease. She underwent cardiac catheterization in 2008 demonstrating mild plaque in the mid LAD and mild to moderate stenosis in the left circumflex. She's had normal left ventricular function. She had an echo in February 2014 demonstrating mild left ventricular hypertrophy with normal LV systolic function and no significant valvular disease.  The patient has chronic shortness of breath and is on home oxygen. Her breathing is stable. She denies orthopnea, PND, or leg swelling. She denies chest pain or pressure.  Outpatient Encounter Prescriptions as of 09/22/2013  Medication Sig  . ascorbic acid (VITAMIN C) 1000 MG tablet Take 1,000 mg by mouth daily.  Marland Kitchen aspirin 325 MG tablet Take 325 mg by mouth daily.    Marland Kitchen atenolol (TENORMIN) 25 MG tablet TAKE ONE TABLET BY MOUTH EVERY DAY  . butalbital-aspirin-caffeine (FIORINAL) 50-325-40 MG per capsule TAKE ONE TO TWO CAPSULES BY MOUTH EVERY 6 HOURS AS NEEDED FOR HEADACHE **DO  NOT  EXCEED  6  CAPSULES  BY  MOUTH  IN  24  HOURS**  . cetirizine (ZYRTEC) 10 MG tablet Take 10 mg by mouth daily.   . cholecalciferol (VITAMIN D) 1000 UNITS tablet Take 1,000 Units by mouth daily.  . Coenzyme Q10 (COQ10) 100 MG CAPS Take 1 tablet by mouth daily.    . CRESTOR 20 MG tablet TAKE ONE TABLET BY MOUTH ONCE DAILY  . cromolyn (NASALCROM) 5.2 MG/ACT nasal spray Place 1 spray into the nose 2 (two) times daily.  . diphenhydrAMINE (BENADRYL) 25 MG tablet Take 25 mg by mouth every 6 (six) hours as needed. 25 or 50 mg as needed  . DULERA 100-5 MCG/ACT AERO INHALE TWO PUFFS BY MOUTH TWICE DAILY  . estradiol (VAGIFEM) 25 MCG vaginal tablet Insert one vaginally every 3 days   . fish oil-omega-3 fatty acids 1000 MG capsule Take 1 capsule by mouth daily.    . GuaiFENesin (MUCINEX PO) Take by mouth as needed.    . hydrochlorothiazide (HYDRODIURIL) 25 MG tablet TAKE ONE TABLET BY MOUTH EVERY DAY  .  losartan (COZAAR) 50 MG tablet TAKE ONE TABLET BY MOUTH EVERY DAY  . Multiple Vitamins-Minerals (CENTRUM SILVER PO) Take one daily  . nitroGLYCERIN (NITROSTAT) 0.4 MG SL tablet Place 0.4 mg under the tongue every 5 (five) minutes as needed.    . pantoprazole (PROTONIX) 40 MG tablet TAKE ONE TABLET BY MOUTH TWICE DAILY  . phenazopyridine (PYRIDIUM) 200 MG tablet Take 200 mg by mouth 3 (three) times daily as needed.  . polyethylene glycol (MIRALAX / GLYCOLAX) packet Take 17 g by mouth daily.  . promethazine (PHENERGAN) 25 MG tablet Take 25 mg by mouth every 6 (six) hours as needed for nausea.  Marland Kitchen saccharomyces boulardii (FLORASTOR) 250 MG capsule Take 250 mg by mouth 2 (two) times daily.    Marland Kitchen tiotropium (SPIRIVA) 18 MCG inhalation capsule Place 18 mcg into inhaler and inhale daily.    . Triamcinolone Acetonide (NASACORT ALLERGY 24HR NA) Place into the nose.  . valACYclovir (VALTREX) 500 MG tablet TAKE ONE TABLET BY MOUTH ONCE DAILY  . Zinc 100 MG TABS Take one tablet daily    Allergies  Allergen Reactions  . Nitrofurantoin Monohyd Macro Shortness Of Breath  . Thorazine [Chlorpromazine Hcl]     Past Medical History  Diagnosis Date  . Renal cell cancer     (Lt) partial nephrectomy dx 11/06  . Emphysema   . Hypertension   . Chest pain   .  Dyspnea   . CAD (coronary artery disease)   . Hyperlipidemia   . Headache(784.0)   . Goiter   . COPD (chronic obstructive pulmonary disease)   . Asthma   . Allergic rhinitis     BP 110/64  Pulse 76  Ht 5\' 3"  (1.6 m)  Wt 153 lb (69.4 kg)  BMI 27.11 kg/m2  PHYSICAL EXAM: Pt is alert and oriented, pleasant woman in NAD HEENT: normal Neck: JVP - normal, carotids 2+= without bruits Lungs: Diminished air movement bilaterally CV: RRR without murmur or gallop Abd: soft, NT, Positive BS, no hepatomegaly Ext: no C/C/E, distal pulses intact and equal Skin: warm/dry no rash  EKG:  Sinus rhythm with left bundle branch block 76 beats per  minute  ASSESSMENT AND PLAN: 1. Coronary atherosclerosis, native vessel. The patient has had nonobstructive CAD. She has no symptoms of angina. Her medications were reviewed and she is tolerating aspirin, Crestor, and a beta blocker. I will see her back in one year.  2. Hypertension. A pressure is well controlled on a combination of atenolol, hydrochlorothiazide, and losartan.  3. Hyperlipidemia. The patient is tolerating Crestor. Dr. [name] follows her lipids.  Tonny Bollman 09/24/2013 10:41 PM

## 2013-09-28 ENCOUNTER — Other Ambulatory Visit: Payer: BC Managed Care – PPO | Admitting: Internal Medicine

## 2013-09-28 ENCOUNTER — Other Ambulatory Visit: Payer: Self-pay | Admitting: Internal Medicine

## 2013-09-28 DIAGNOSIS — R0600 Dyspnea, unspecified: Secondary | ICD-10-CM

## 2013-09-28 DIAGNOSIS — E785 Hyperlipidemia, unspecified: Secondary | ICD-10-CM

## 2013-09-28 DIAGNOSIS — Z1329 Encounter for screening for other suspected endocrine disorder: Secondary | ICD-10-CM

## 2013-09-28 DIAGNOSIS — Z130 Encounter for screening for diseases of the blood and blood-forming organs and certain disorders involving the immune mechanism: Secondary | ICD-10-CM

## 2013-09-28 DIAGNOSIS — Z8669 Personal history of other diseases of the nervous system and sense organs: Secondary | ICD-10-CM

## 2013-09-28 DIAGNOSIS — I10 Essential (primary) hypertension: Secondary | ICD-10-CM

## 2013-09-28 LAB — CBC WITH DIFFERENTIAL/PLATELET
Basophils Absolute: 0 10*3/uL (ref 0.0–0.1)
Basophils Relative: 1 % (ref 0–1)
Eosinophils Absolute: 0.1 10*3/uL (ref 0.0–0.7)
Eosinophils Relative: 3 % (ref 0–5)
HCT: 45.1 % (ref 36.0–46.0)
Hemoglobin: 15.5 g/dL — ABNORMAL HIGH (ref 12.0–15.0)
Lymphocytes Relative: 27 % (ref 12–46)
Lymphs Abs: 1.4 10*3/uL (ref 0.7–4.0)
MCH: 32.5 pg (ref 26.0–34.0)
MCHC: 34.4 g/dL (ref 30.0–36.0)
MCV: 94.5 fL (ref 78.0–100.0)
Monocytes Absolute: 0.6 10*3/uL (ref 0.1–1.0)
Monocytes Relative: 10 % (ref 3–12)
Neutro Abs: 3.3 10*3/uL (ref 1.7–7.7)
Neutrophils Relative %: 59 % (ref 43–77)
Platelets: 230 10*3/uL (ref 150–400)
RBC: 4.77 MIL/uL (ref 3.87–5.11)
RDW: 13.6 % (ref 11.5–15.5)
WBC: 5.4 10*3/uL (ref 4.0–10.5)

## 2013-09-28 LAB — COMPREHENSIVE METABOLIC PANEL
ALT: 61 U/L — ABNORMAL HIGH (ref 0–35)
AST: 39 U/L — ABNORMAL HIGH (ref 0–37)
Albumin: 4.4 g/dL (ref 3.5–5.2)
Alkaline Phosphatase: 55 U/L (ref 39–117)
BUN: 21 mg/dL (ref 6–23)
CO2: 28 mEq/L (ref 19–32)
Calcium: 10.2 mg/dL (ref 8.4–10.5)
Chloride: 100 mEq/L (ref 96–112)
Creat: 1.14 mg/dL — ABNORMAL HIGH (ref 0.50–1.10)
Glucose, Bld: 86 mg/dL (ref 70–99)
Potassium: 3.7 mEq/L (ref 3.5–5.3)
Sodium: 139 mEq/L (ref 135–145)
Total Bilirubin: 0.5 mg/dL (ref 0.3–1.2)
Total Protein: 6.9 g/dL (ref 6.0–8.3)

## 2013-09-28 LAB — TSH: TSH: 3.139 u[IU]/mL (ref 0.350–4.500)

## 2013-09-28 LAB — LIPID PANEL
Cholesterol: 176 mg/dL (ref 0–200)
HDL: 46 mg/dL (ref >39)
LDL Cholesterol: 108 mg/dL — ABNORMAL HIGH (ref 0–99)
Total CHOL/HDL Ratio: 3.8 Ratio
Triglycerides: 112 mg/dL (ref <150)
VLDL: 22 mg/dL (ref 0–40)

## 2013-09-28 LAB — HEMOGLOBIN A1C
Hgb A1c MFr Bld: 5.9 % — ABNORMAL HIGH (ref <5.7)
Mean Plasma Glucose: 123 mg/dL — ABNORMAL HIGH (ref <117)

## 2013-09-29 ENCOUNTER — Ambulatory Visit (INDEPENDENT_AMBULATORY_CARE_PROVIDER_SITE_OTHER): Payer: BC Managed Care – PPO | Admitting: Internal Medicine

## 2013-09-29 ENCOUNTER — Encounter: Payer: Self-pay | Admitting: Internal Medicine

## 2013-09-29 VITALS — BP 108/70 | HR 80 | Temp 98.5°F | Ht 63.0 in | Wt 150.0 lb

## 2013-09-29 DIAGNOSIS — Z8669 Personal history of other diseases of the nervous system and sense organs: Secondary | ICD-10-CM

## 2013-09-29 DIAGNOSIS — R748 Abnormal levels of other serum enzymes: Secondary | ICD-10-CM

## 2013-09-29 DIAGNOSIS — Z9981 Dependence on supplemental oxygen: Secondary | ICD-10-CM

## 2013-09-29 DIAGNOSIS — R7302 Impaired glucose tolerance (oral): Secondary | ICD-10-CM

## 2013-09-29 DIAGNOSIS — Z8679 Personal history of other diseases of the circulatory system: Secondary | ICD-10-CM

## 2013-09-29 DIAGNOSIS — Z0000 Encounter for general adult medical examination without abnormal findings: Secondary | ICD-10-CM

## 2013-09-29 DIAGNOSIS — Z8619 Personal history of other infectious and parasitic diseases: Secondary | ICD-10-CM

## 2013-09-29 DIAGNOSIS — J309 Allergic rhinitis, unspecified: Secondary | ICD-10-CM

## 2013-09-29 DIAGNOSIS — E785 Hyperlipidemia, unspecified: Secondary | ICD-10-CM

## 2013-09-29 DIAGNOSIS — Z8709 Personal history of other diseases of the respiratory system: Secondary | ICD-10-CM

## 2013-09-29 DIAGNOSIS — I10 Essential (primary) hypertension: Secondary | ICD-10-CM

## 2013-09-29 DIAGNOSIS — R7309 Other abnormal glucose: Secondary | ICD-10-CM

## 2013-09-29 DIAGNOSIS — Z85528 Personal history of other malignant neoplasm of kidney: Secondary | ICD-10-CM

## 2013-09-29 DIAGNOSIS — Z87891 Personal history of nicotine dependence: Secondary | ICD-10-CM

## 2013-09-29 DIAGNOSIS — N390 Urinary tract infection, site not specified: Secondary | ICD-10-CM

## 2013-09-29 LAB — POCT URINALYSIS DIPSTICK
Bilirubin, UA: NEGATIVE
Blood, UA: NEGATIVE
Glucose, UA: NEGATIVE
Leukocytes, UA: NEGATIVE
Protein, UA: NEGATIVE
Spec Grav, UA: 1.015
Urobilinogen, UA: 0.2
pH, UA: 6.5

## 2013-09-29 LAB — VITAMIN D 25 HYDROXY (VIT D DEFICIENCY, FRACTURES): Vit D, 25-Hydroxy: 86 ng/mL (ref 30–89)

## 2013-09-30 ENCOUNTER — Encounter: Payer: Self-pay | Admitting: Internal Medicine

## 2013-09-30 ENCOUNTER — Ambulatory Visit: Payer: BC Managed Care – PPO | Admitting: Cardiovascular Disease

## 2013-09-30 ENCOUNTER — Other Ambulatory Visit: Payer: Self-pay | Admitting: Internal Medicine

## 2013-09-30 DIAGNOSIS — Z780 Asymptomatic menopausal state: Secondary | ICD-10-CM

## 2013-09-30 DIAGNOSIS — M899 Disorder of bone, unspecified: Secondary | ICD-10-CM

## 2013-09-30 DIAGNOSIS — Z1231 Encounter for screening mammogram for malignant neoplasm of breast: Secondary | ICD-10-CM

## 2013-10-02 LAB — URINE CULTURE: Colony Count: >=100000

## 2013-10-02 NOTE — Telephone Encounter (Signed)
Patient has Escherichia coli UTI on culture. Call in Keflex 500 mg 4 times daily for 7 days to pharmacy. She should have urine rechecked before departing on her trip to the [place]. Message sent to patient through  My Chart and by phone.

## 2013-10-07 ENCOUNTER — Ambulatory Visit
Admission: RE | Admit: 2013-10-07 | Discharge: 2013-10-07 | Disposition: A | Discharge status: Home / Self Care | Payer: BC Managed Care – PPO | Source: Ambulatory Visit | Attending: Internal Medicine | Admitting: Internal Medicine

## 2013-10-07 DIAGNOSIS — R748 Abnormal levels of other serum enzymes: Secondary | ICD-10-CM

## 2013-10-08 ENCOUNTER — Telehealth: Payer: Self-pay | Admitting: Internal Medicine

## 2013-10-08 NOTE — Telephone Encounter (Signed)
[facility] to advise of this.   Spoke with Jenel Lucks to sched US Abdomen w/contrast.  12/8 @ 2:30 at [address]    Called patient to advise.  Patient doesn't want to have test before her anniversary trip as her "hands will be bruised from the needles".  Wanted to know if Dr [name] thought it was ok to R/S the imaging once she returns from her 10 day trip.  Dr. [name] advised to inform patient of the possible cyst on a lobe of the liver and that she can in fact R/S once she returns.  Spoke with patient to adv that she can put off imaging.  Patient is happy with that.  She'll call upon her return home to R/S.

## 2013-10-09 ENCOUNTER — Other Ambulatory Visit (INDEPENDENT_AMBULATORY_CARE_PROVIDER_SITE_OTHER): Payer: BC Managed Care – PPO | Admitting: Internal Medicine

## 2013-10-09 DIAGNOSIS — N390 Urinary tract infection, site not specified: Secondary | ICD-10-CM

## 2013-10-09 LAB — POCT URINALYSIS DIPSTICK
Bilirubin, UA: NEGATIVE
Blood, UA: NEGATIVE
Glucose, UA: NEGATIVE
Leukocytes, UA: NEGATIVE
Nitrite, UA: NEGATIVE
Protein, UA: NEGATIVE
Spec Grav, UA: 1.015
Urobilinogen, UA: 0.2
pH, UA: 6.5

## 2013-10-12 ENCOUNTER — Other Ambulatory Visit: Payer: BC Managed Care – PPO

## 2013-10-27 ENCOUNTER — Ambulatory Visit (INDEPENDENT_AMBULATORY_CARE_PROVIDER_SITE_OTHER): Payer: BC Managed Care – PPO | Admitting: Internal Medicine

## 2013-10-27 ENCOUNTER — Encounter: Payer: Self-pay | Admitting: Internal Medicine

## 2013-10-27 VITALS — BP 150/90 | Resp 14

## 2013-10-27 DIAGNOSIS — J209 Acute bronchitis, unspecified: Secondary | ICD-10-CM

## 2013-10-27 MED ORDER — LEVOFLOXACIN 500 MG PO TABS: 500.0000 mg | ORAL_TABLET | Freq: Every day | ORAL | Status: DC

## 2013-10-27 MED ORDER — HYDROCOD POLST-CHLORPHEN POLST 10-8 MG/5ML PO LQCR: 5.0000 mL | Freq: Two times a day (BID) | ORAL | Status: DC | PRN

## 2013-11-01 NOTE — Progress Notes (Signed)
   Subjective:    Patient ID: [name], female    DOB: 1951-08-03, 62 y.o.   MRN: [id number]  HPI Patient just returned from cruise to the [place]. While sitting in the airport in [place] for a number of hours, she thinks she came down with an upper respiratory infection by the time she got home she was ill. Has cough and congestion. Has had fever. Husband is coming down with similar illness. Patient has history of severe COPD and uses home and portable oxygen. She started taking some Augmentin she had on hand.    Review of Systems     Objective:   Physical Exam skin is warm and dry. She sounds nasally congested when she speaks. TMs are clear. Pharynx is clear. Neck is supple. Chest clear without rales or wheezing.        Assessment & Plan:  Bronchitis  COPD  Plan: Levaquin 500 milligrams daily for 10 days. Discontinue Augmentin. Hycodan 8 ounces 1 teaspoon by mouth every 8 hours when necessary cough.

## 2013-11-01 NOTE — Patient Instructions (Signed)
Take Levaquin as directed and cough syrup as needed

## 2013-11-10 ENCOUNTER — Other Ambulatory Visit: Payer: BC Managed Care – PPO

## 2013-11-10 ENCOUNTER — Ambulatory Visit: Payer: BC Managed Care – PPO

## 2013-11-17 ENCOUNTER — Encounter: Payer: Self-pay | Admitting: Internal Medicine

## 2013-11-19 ENCOUNTER — Encounter: Payer: Self-pay | Admitting: Internal Medicine

## 2013-12-10 ENCOUNTER — Encounter: Payer: Self-pay | Admitting: Internal Medicine

## 2013-12-10 ENCOUNTER — Ambulatory Visit (INDEPENDENT_AMBULATORY_CARE_PROVIDER_SITE_OTHER): Payer: BC Managed Care – PPO | Admitting: Internal Medicine

## 2013-12-10 VITALS — BP 142/84 | HR 80 | Temp 98.1°F | Wt 138.0 lb

## 2013-12-10 DIAGNOSIS — R300 Dysuria: Secondary | ICD-10-CM

## 2013-12-10 DIAGNOSIS — N390 Urinary tract infection, site not specified: Secondary | ICD-10-CM

## 2013-12-10 LAB — POCT URINALYSIS DIPSTICK
Bilirubin, UA: NEGATIVE
Blood, UA: NEGATIVE
Glucose, UA: NEGATIVE
Ketones, UA: NEGATIVE
Nitrite, UA: POSITIVE
Protein, UA: NEGATIVE
Spec Grav, UA: 1.015
Urobilinogen, UA: NEGATIVE
pH, UA: 5.5

## 2013-12-10 NOTE — Progress Notes (Signed)
   Subjective:    Patient ID: [name], female    DOB: [birth year], 63 y.o.   MRN: [id number]  HPI Patient has noticed some slight dysuria and urinary frequency. History of recurrent urinary tract infections. Underwent partial nephrectomy for a large septated cyst that had a focus of renal cell carcinoma in 2007. History of COPD. Is on home oxygen therapy. Recently had respiratory infection that required steroids to resolve.    Review of Systems     Objective:   Physical Exam  Urinalysis today is abnormal. No fever shaking chills or CVA tenderness      Assessment & Plan:  Probable UTI  Plan: Since she has a history of drug resistant UTIs, we will await urine culture results before treating.

## 2013-12-10 NOTE — Patient Instructions (Signed)
Await urine culture results

## 2013-12-12 ENCOUNTER — Telehealth: Payer: Self-pay | Admitting: Internal Medicine

## 2013-12-12 DIAGNOSIS — N390 Urinary tract infection, site not specified: Secondary | ICD-10-CM

## 2013-12-12 LAB — URINE CULTURE: Colony Count: >=100000

## 2013-12-12 MED ORDER — NITROFURANTOIN MONOHYD MACRO 100 MG PO CAPS: 100.0000 mg | ORAL_CAPSULE | Freq: Two times a day (BID) | ORAL | Status: DC

## 2013-12-12 NOTE — Telephone Encounter (Signed)
Has Escherichia coli UTI resistant to a number of drugs. She says nitrofurantoin has caused shortness of breath in the past. She's willing to try once again. Call in nitrofurantoin 100 mg #14 1 by mouth twice daily. If this does not agree with her she will have to get injections of Rocephin.

## 2013-12-14 NOTE — Progress Notes (Signed)
Patient aware. Already taking medicine.

## 2013-12-31 ENCOUNTER — Other Ambulatory Visit: Payer: BC Managed Care – PPO | Admitting: Internal Medicine

## 2014-01-01 ENCOUNTER — Ambulatory Visit (INDEPENDENT_AMBULATORY_CARE_PROVIDER_SITE_OTHER): Payer: BC Managed Care – PPO | Admitting: Internal Medicine

## 2014-01-01 ENCOUNTER — Encounter: Payer: Self-pay | Admitting: Internal Medicine

## 2014-01-01 VITALS — BP 128/84 | HR 80 | Temp 98.8°F | Ht 63.0 in | Wt 139.0 lb

## 2014-01-01 DIAGNOSIS — Z79899 Other long term (current) drug therapy: Secondary | ICD-10-CM

## 2014-01-01 DIAGNOSIS — Z87440 Personal history of urinary (tract) infections: Secondary | ICD-10-CM

## 2014-01-01 DIAGNOSIS — E785 Hyperlipidemia, unspecified: Secondary | ICD-10-CM

## 2014-01-01 DIAGNOSIS — R7301 Impaired fasting glucose: Secondary | ICD-10-CM

## 2014-01-01 LAB — LIPID PANEL
Cholesterol: 183 mg/dL (ref 0–200)
HDL: 53 mg/dL (ref >39)
LDL Cholesterol: 107 mg/dL — ABNORMAL HIGH (ref 0–99)
Total CHOL/HDL Ratio: 3.5 Ratio
Triglycerides: 113 mg/dL (ref <150)
VLDL: 23 mg/dL (ref 0–40)

## 2014-01-01 LAB — HEMOGLOBIN A1C
Hgb A1c MFr Bld: 5.8 % — ABNORMAL HIGH (ref <5.7)
Mean Plasma Glucose: 120 mg/dL — ABNORMAL HIGH (ref <117)

## 2014-01-01 LAB — POCT URINALYSIS DIPSTICK
Bilirubin, UA: NEGATIVE
Blood, UA: NEGATIVE
Glucose, UA: NEGATIVE
Ketones, UA: NEGATIVE
Leukocytes, UA: NEGATIVE
Nitrite, UA: NEGATIVE
Protein, UA: NEGATIVE
Spec Grav, UA: 1.01
Urobilinogen, UA: NEGATIVE
pH, UA: 5.5

## 2014-01-01 MED ORDER — LEVOFLOXACIN 500 MG PO TABS: 500.0000 mg | ORAL_TABLET | Freq: Every day | ORAL | Status: DC

## 2014-01-01 MED ORDER — PREDNISONE 10 MG PO KIT: PACK | ORAL | Status: DC

## 2014-01-01 NOTE — Progress Notes (Signed)
   Subjective:    Patient ID: [name], female    DOB: 1951/09/08, 63 y.o.   MRN: [id number]  HPI Here to followup on impaired glucose tolerance. Last visit hemoglobin A1c was 5.9%. No longer drinks wine. Has been eating more red meat than usual. She is taking a supplement called Tru Vision which gives her energy and does help her lose weight. Contains chromium picolinate and some herbs. Going on another cruise to the [place] late April. Recently had urinary tract infection. We gave her nitrofurantoin which she tolerated although she complained that caused some chest tightness. She had multidrug resistant Escherichia coli that was sensitive to nitrofurantoin. Urinalysis today shows resolution of infection. She continues to wear home oxygen. Says she needs to followup with Dr. Gwenette Greet in the near future    Review of Systems     Objective:   Physical Exam Not examined. Hemoglobin A1c drawn. Lipid panel liver functions drawn.       Assessment & Plan:  UTI-resolved  Impaired glucose tolerance-hemoglobin A1c pending  Hyperlipidemia-fasting lipid panel liver functions drawn  History of recurrent urinary tract infections  Estrogen replacement-prescription written for Vagifem tablets to use twice weekly refillable for 6 months  Travel medicine-going to [place]. Have refilled Levaquin 500 mg daily for 10 days. Also prescription to take with her nitrofurantoin 100 mg twice daily for 10 days should urinary tract infection develop while on trip. Also at her request, prednisone 10 mg #42 tablets to take in tapering course over 12 days should she develop cough and wheezing while on trip.  25 minutes patient addressing these issues

## 2014-01-10 ENCOUNTER — Encounter: Payer: Self-pay | Admitting: Internal Medicine

## 2014-01-10 NOTE — Patient Instructions (Signed)
Watch diet return in 3 months for hemoglobin A1c. Continue same medications.
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
## 2014-02-10 ENCOUNTER — Encounter: Payer: Self-pay | Admitting: Internal Medicine

## 2014-02-11 DIAGNOSIS — Z0289 Encounter for other administrative examinations: Secondary | ICD-10-CM

## 2014-02-16 ENCOUNTER — Other Ambulatory Visit: Payer: Self-pay | Admitting: Internal Medicine

## 2014-02-20 ENCOUNTER — Other Ambulatory Visit: Payer: Self-pay | Admitting: Internal Medicine

## 2014-02-22 ENCOUNTER — Other Ambulatory Visit: Payer: Self-pay | Admitting: Internal Medicine

## 2014-03-23 ENCOUNTER — Encounter: Payer: Self-pay | Admitting: Internal Medicine

## 2014-03-23 ENCOUNTER — Other Ambulatory Visit: Payer: Self-pay

## 2014-03-23 MED ORDER — ATENOLOL 25 MG PO TABS: 25.0000 mg | ORAL_TABLET | Freq: Every day | ORAL | Status: DC

## 2014-04-01 ENCOUNTER — Encounter: Payer: Self-pay | Admitting: Internal Medicine

## 2014-04-06 ENCOUNTER — Other Ambulatory Visit: Payer: Self-pay | Admitting: Internal Medicine

## 2014-04-06 NOTE — Telephone Encounter (Signed)
Refill x 6 months 

## 2014-04-26 ENCOUNTER — Other Ambulatory Visit: Payer: Self-pay | Admitting: Internal Medicine

## 2014-07-15 ENCOUNTER — Other Ambulatory Visit: Payer: Self-pay | Admitting: Internal Medicine

## 2014-08-20 ENCOUNTER — Other Ambulatory Visit: Payer: Self-pay

## 2014-08-22 ENCOUNTER — Ambulatory Visit (INDEPENDENT_AMBULATORY_CARE_PROVIDER_SITE_OTHER): Payer: BC Managed Care – PPO | Admitting: Internal Medicine

## 2014-08-22 DIAGNOSIS — Z23 Encounter for immunization: Secondary | ICD-10-CM

## 2014-08-23 DIAGNOSIS — Z23 Encounter for immunization: Secondary | ICD-10-CM

## 2014-08-26 ENCOUNTER — Other Ambulatory Visit: Payer: BC Managed Care – PPO | Admitting: Internal Medicine

## 2014-09-13 ENCOUNTER — Other Ambulatory Visit: Payer: BC Managed Care – PPO | Admitting: Internal Medicine

## 2014-09-13 DIAGNOSIS — Z1321 Encounter for screening for nutritional disorder: Secondary | ICD-10-CM

## 2014-09-13 DIAGNOSIS — Z0000 Encounter for general adult medical examination without abnormal findings: Secondary | ICD-10-CM

## 2014-09-13 DIAGNOSIS — R7301 Impaired fasting glucose: Secondary | ICD-10-CM

## 2014-09-13 DIAGNOSIS — I10 Essential (primary) hypertension: Secondary | ICD-10-CM

## 2014-09-13 DIAGNOSIS — Z1329 Encounter for screening for other suspected endocrine disorder: Secondary | ICD-10-CM

## 2014-09-13 DIAGNOSIS — E785 Hyperlipidemia, unspecified: Secondary | ICD-10-CM

## 2014-09-13 DIAGNOSIS — Z130 Encounter for screening for diseases of the blood and blood-forming organs and certain disorders involving the immune mechanism: Secondary | ICD-10-CM

## 2014-09-13 DIAGNOSIS — Z13220 Encounter for screening for lipoid disorders: Secondary | ICD-10-CM

## 2014-09-13 LAB — COMPREHENSIVE METABOLIC PANEL
ALT: 21 U/L (ref 0–35)
AST: 22 U/L (ref 0–37)
Albumin: 4.3 g/dL (ref 3.5–5.2)
Alkaline Phosphatase: 53 U/L (ref 39–117)
BUN: 17 mg/dL (ref 6–23)
CO2: 29 mEq/L (ref 19–32)
Calcium: 9.3 mg/dL (ref 8.4–10.5)
Chloride: 101 mEq/L (ref 96–112)
Creat: 0.83 mg/dL (ref 0.50–1.10)
Glucose, Bld: 77 mg/dL (ref 70–99)
Potassium: 4 mEq/L (ref 3.5–5.3)
Sodium: 141 mEq/L (ref 135–145)
Total Bilirubin: 0.4 mg/dL (ref 0.2–1.2)
Total Protein: 6.4 g/dL (ref 6.0–8.3)

## 2014-09-13 LAB — CBC WITH DIFFERENTIAL/PLATELET
Basophils Absolute: 0.1 10*3/uL (ref 0.0–0.1)
Basophils Relative: 1 % (ref 0–1)
Eosinophils Absolute: 0.2 10*3/uL (ref 0.0–0.7)
Eosinophils Relative: 3 % (ref 0–5)
HCT: 43.8 % (ref 36.0–46.0)
Hemoglobin: 14.8 g/dL (ref 12.0–15.0)
Lymphocytes Relative: 26 % (ref 12–46)
Lymphs Abs: 1.6 10*3/uL (ref 0.7–4.0)
MCH: 30.8 pg (ref 26.0–34.0)
MCHC: 33.8 g/dL (ref 30.0–36.0)
MCV: 91.3 fL (ref 78.0–100.0)
Monocytes Absolute: 0.7 10*3/uL (ref 0.1–1.0)
Monocytes Relative: 11 % (ref 3–12)
Neutro Abs: 3.7 10*3/uL (ref 1.7–7.7)
Neutrophils Relative %: 59 % (ref 43–77)
Platelets: 223 10*3/uL (ref 150–400)
RBC: 4.8 MIL/uL (ref 3.87–5.11)
RDW: 13.5 % (ref 11.5–15.5)
WBC: 6.3 10*3/uL (ref 4.0–10.5)

## 2014-09-13 LAB — LIPID PANEL
Cholesterol: 352 mg/dL — ABNORMAL HIGH (ref 0–200)
HDL: 64 mg/dL (ref >39)
LDL Cholesterol: 253 mg/dL — ABNORMAL HIGH (ref 0–99)
Total CHOL/HDL Ratio: 5.5 Ratio
Triglycerides: 174 mg/dL — ABNORMAL HIGH (ref <150)
VLDL: 35 mg/dL (ref 0–40)

## 2014-09-13 LAB — TSH: TSH: 3.464 u[IU]/mL (ref 0.350–4.500)

## 2014-09-14 ENCOUNTER — Encounter: Payer: Self-pay | Admitting: Internal Medicine

## 2014-09-14 LAB — VITAMIN D 25 HYDROXY (VIT D DEFICIENCY, FRACTURES): Vit D, 25-Hydroxy: 67 ng/mL (ref 30–89)

## 2014-09-14 LAB — HEMOGLOBIN A1C
Hgb A1c MFr Bld: 5.7 % — ABNORMAL HIGH (ref <5.7)
Mean Plasma Glucose: 117 mg/dL — ABNORMAL HIGH (ref <117)

## 2014-10-01 ENCOUNTER — Other Ambulatory Visit: Payer: Self-pay | Admitting: Internal Medicine

## 2014-10-04 ENCOUNTER — Ambulatory Visit (INDEPENDENT_AMBULATORY_CARE_PROVIDER_SITE_OTHER): Payer: BC Managed Care – PPO | Admitting: Internal Medicine

## 2014-10-04 ENCOUNTER — Encounter: Payer: Self-pay | Admitting: Internal Medicine

## 2014-10-04 VITALS — BP 118/70 | HR 72 | Temp 98.2°F | Ht 63.0 in | Wt 134.5 lb

## 2014-10-04 DIAGNOSIS — I10 Essential (primary) hypertension: Secondary | ICD-10-CM

## 2014-10-04 DIAGNOSIS — Z87891 Personal history of nicotine dependence: Secondary | ICD-10-CM

## 2014-10-04 DIAGNOSIS — R319 Hematuria, unspecified: Secondary | ICD-10-CM

## 2014-10-04 DIAGNOSIS — Z720 Tobacco use: Secondary | ICD-10-CM

## 2014-10-04 DIAGNOSIS — Z8679 Personal history of other diseases of the circulatory system: Secondary | ICD-10-CM

## 2014-10-04 DIAGNOSIS — Z8709 Personal history of other diseases of the respiratory system: Secondary | ICD-10-CM

## 2014-10-04 DIAGNOSIS — Z87440 Personal history of urinary (tract) infections: Secondary | ICD-10-CM

## 2014-10-04 DIAGNOSIS — E785 Hyperlipidemia, unspecified: Secondary | ICD-10-CM

## 2014-10-04 DIAGNOSIS — Z9981 Dependence on supplemental oxygen: Secondary | ICD-10-CM

## 2014-10-04 DIAGNOSIS — Z0000 Encounter for general adult medical examination without abnormal findings: Secondary | ICD-10-CM

## 2014-10-04 DIAGNOSIS — Z8669 Personal history of other diseases of the nervous system and sense organs: Secondary | ICD-10-CM

## 2014-10-04 DIAGNOSIS — Z85528 Personal history of other malignant neoplasm of kidney: Secondary | ICD-10-CM

## 2014-10-04 LAB — POCT URINALYSIS DIPSTICK
Spec Grav, UA: 1.015
pH, UA: 6

## 2014-10-05 LAB — URINALYSIS, MICROSCOPIC ONLY
Casts: NONE SEEN
Crystals: NONE SEEN
Squamous Epithelial / LPF: NONE SEEN

## 2014-10-07 LAB — URINE CULTURE: Colony Count: >=100000

## 2014-10-08 ENCOUNTER — Telehealth: Payer: Self-pay | Admitting: Internal Medicine

## 2014-10-08 DIAGNOSIS — Z0000 Encounter for general adult medical examination without abnormal findings: Secondary | ICD-10-CM

## 2014-10-08 NOTE — Telephone Encounter (Signed)
Has Klebsiella UTI. Will take Cipro for 5 days and come in for u/a c and s. Needs order for mammogram

## 2014-10-13 ENCOUNTER — Other Ambulatory Visit: Payer: Self-pay | Admitting: Internal Medicine

## 2014-10-13 DIAGNOSIS — Z1231 Encounter for screening mammogram for malignant neoplasm of breast: Secondary | ICD-10-CM

## 2014-10-22 ENCOUNTER — Telehealth: Payer: Self-pay | Admitting: Internal Medicine

## 2014-10-22 ENCOUNTER — Other Ambulatory Visit: Payer: Self-pay

## 2014-10-22 MED ORDER — BUTALBITAL-ASA-CAFFEINE 50-325-40 MG PO CAPS: ORAL_CAPSULE | ORAL | Status: DC

## 2014-10-22 NOTE — Telephone Encounter (Signed)
[name] has already faxed refill for Fiorinal for patient.

## 2014-11-10 ENCOUNTER — Encounter: Payer: Self-pay | Admitting: Pulmonary Disease

## 2014-11-10 ENCOUNTER — Ambulatory Visit (INDEPENDENT_AMBULATORY_CARE_PROVIDER_SITE_OTHER): Payer: BLUE CROSS/BLUE SHIELD | Admitting: Pulmonary Disease

## 2014-11-10 VITALS — BP 124/74 | HR 71 | Temp 97.0°F | Ht 63.0 in | Wt 131.4 lb

## 2014-11-10 DIAGNOSIS — J438 Other emphysema: Secondary | ICD-10-CM

## 2014-11-10 NOTE — Progress Notes (Signed)
   Subjective:    Patient ID: [name], female    DOB: Mar 09, 1951, 64 y.o.   MRN: [id number]  HPI Patient comes in today for follow-up of her known COPD with chronic respiratory failure. She has done very well since the last visit, and tells me that she has not had an acute exacerbation. She feels that her breathing is doing very well, and has lost significant weight. She currently denies any significant cough or congestion. She is not taking her medications regular basis, but feels that her symptoms are unaffected.   Review of Systems  Constitutional: Negative for fever and unexpected weight change.  HENT: Negative for congestion, dental problem, ear pain, nosebleeds, postnasal drip, rhinorrhea, sinus pressure, sneezing, sore throat and trouble swallowing.   Eyes: Negative for redness and itching.  Respiratory: Positive for shortness of breath. Negative for cough, chest tightness and wheezing.   Cardiovascular: Negative for palpitations and leg swelling.  Gastrointestinal: Negative for nausea and vomiting.  Genitourinary: Negative for dysuria.  Musculoskeletal: Negative for joint swelling.  Skin: Negative for rash.  Neurological: Negative for headaches.  Hematological: Does not bruise/bleed easily.  Psychiatric/Behavioral: Negative for dysphoric mood. The patient is not nervous/anxious.        Objective:   Physical Exam Well-developed female in no acute distress Nose without purulence or discharge noted Neck without lymphadenopathy or thyromegaly Chest with mild decrease in breath sounds, but no wheezes or crackles Cardiac exam with regular rate and rhythm Lower extremities with no significant edema, no cyanosis Alert and oriented, moves all 4 extremities.       Assessment & Plan:

## 2014-11-10 NOTE — Assessment & Plan Note (Signed)
The patient feels that she has been doing very well from a COPD standpoint. She is not taking her medications on a consistent basis, but is not having increased symptoms or acute exacerbations. I am okay with her doing this provided she meets both of those criteria. I have asked her to stay as active as possible, and to continue on her oxygen. I suspect her recent weight loss has made a big difference in her breathing and exertional tolerance.

## 2014-11-10 NOTE — Patient Instructions (Signed)
Continue on your breathing meds as we discussed. Congratulations on your weight loss.  You are doing great. followup with me again in one year if doing well, but call if having issues.

## 2014-12-01 ENCOUNTER — Ambulatory Visit (INDEPENDENT_AMBULATORY_CARE_PROVIDER_SITE_OTHER): Payer: BLUE CROSS/BLUE SHIELD | Admitting: Cardiovascular Disease

## 2014-12-01 ENCOUNTER — Encounter: Payer: Self-pay | Admitting: Cardiovascular Disease

## 2014-12-01 VITALS — BP 130/72 | HR 66 | Ht 63.0 in | Wt 131.0 lb

## 2014-12-01 DIAGNOSIS — E785 Hyperlipidemia, unspecified: Secondary | ICD-10-CM

## 2014-12-01 DIAGNOSIS — I2510 Atherosclerotic heart disease of native coronary artery without angina pectoris: Secondary | ICD-10-CM

## 2014-12-01 NOTE — Patient Instructions (Signed)
Your physician wants you to follow-up in: 1 YEAR with Dr Cooper.  You will receive a reminder letter in the mail two months in advance. If you don't receive a letter, please call our office to schedule the follow-up appointment.  Your physician recommends that you continue on your current medications as directed. Please refer to the Current Medication list given to you today.  

## 2014-12-01 NOTE — Progress Notes (Signed)
Cardiology Office Note   Date:  12/01/2014   ID:  [name], DOB 06/21/1951, MRN [id number]  PCP:  Elby Showers, MD  Cardiologist:  Sherren Mocha, MD    No chief complaint on file.    History of Present Illness: [name] is a 64 y.o. female who presents for follow-up of nonobstructive coronary artery disease. She underwent cardiac catheterization in 2008 demonstrating mild plaque in the mid LAD and mild to moderate stenosis in the left circumflex. She's had normal left ventricular function. She had an echo in February 2014 demonstrating mild left ventricular hypertrophy with normal LV systolic function and no significant valvular disease.  The patient is doing very well. She's lost weight and feels much better with her weight loss. Her breathing has improved. Her blood pressure control has improved. She's had no chest pain, chest pressure, or leg swelling. She remains on home O2, but notes that her exercise capacity has increased. She has no complaints today.   Past Medical History  Diagnosis Date  . Renal cell cancer     (Lt) partial nephrectomy dx 11/06  . Emphysema   . Hypertension   . Chest pain   . Dyspnea   . CAD (coronary artery disease)   . Hyperlipidemia   . Headache(784.0)   . Goiter   . COPD (chronic obstructive pulmonary disease)   . Asthma   . Allergic rhinitis     Past Surgical History  Procedure Laterality Date  . Cesarean section      x 3  . Nephrectomy      partial  . Uretheral re-implantation      Current Outpatient Prescriptions  Medication Sig Dispense Refill  . ascorbic acid (VITAMIN C) 1000 MG tablet Take 1,000 mg by mouth daily.    Marland Kitchen aspirin 325 MG tablet Take 325 mg by mouth daily.      Marland Kitchen atenolol (TENORMIN) 25 MG tablet Take 1 tablet (25 mg total) by mouth daily. 90 tablet PRN  . butalbital-aspirin-caffeine (FIORINAL) 50-325-40 MG per capsule TAKE ONE TO TWO CAPSULES BY MOUTH EVERY 6 HOURS AS NEEDED FOR HEADACHE **DO  NOT   EXCEED  6  CAPSULES  BY  MOUTH  IN  24  HOURS** 60 capsule 3  . cetirizine (ZYRTEC) 10 MG tablet Take 10 mg by mouth daily.     . cholecalciferol (VITAMIN D) 1000 UNITS tablet Take 1,000 Units by mouth daily.    . Coenzyme Q10 (COQ10) 100 MG CAPS Take 1 tablet by mouth daily.      . CRESTOR 20 MG tablet TAKE ONE TABLET BY MOUTH ONCE DAILY 90 tablet 0  . diphenhydrAMINE (BENADRYL) 25 MG tablet Take 25 mg by mouth every 6 (six) hours as needed. 25 or 50 mg as needed    . DULERA 100-5 MCG/ACT AERO INHALE TWO PUFFS BY MOUTH TWICE DAILY (Patient taking differently: INHALE TWO PUFFS BY MOUTH TWICE DAILY as needed) 13 g 5  . estradiol (VAGIFEM) 25 MCG vaginal tablet Insert one vaginally every 3 days     . fish oil-omega-3 fatty acids 1000 MG capsule Take 1 capsule by mouth daily.      . GuaiFENesin (MUCINEX PO) Take by mouth as needed.      . hydrochlorothiazide (HYDRODIURIL) 25 MG tablet TAKE ONE TABLET BY MOUTH EVERY DAY 90 tablet 1  . losartan (COZAAR) 25 MG tablet Take 25 mg by mouth daily.    . Multiple Vitamins-Minerals (CENTRUM SILVER PO) Take one  daily    . nitrofurantoin, macrocrystal-monohydrate, (MACROBID) 100 MG capsule Take 1 capsule (100 mg total) by mouth 2 (two) times daily. (Patient taking differently: Take 100 mg by mouth as needed. ) 14 capsule 0  . nitroGLYCERIN (NITROSTAT) 0.4 MG SL tablet Place 0.4 mg under the tongue every 5 (five) minutes as needed.      Marland Kitchen OVER THE COUNTER MEDICATION tru weight and energy 1 BID    . pantoprazole (PROTONIX) 40 MG tablet TAKE ONE TABLET BY MOUTH TWICE DAILY 180 tablet 3  . phenazopyridine (PYRIDIUM) 200 MG tablet Take 200 mg by mouth 3 (three) times daily as needed.    . polyethylene glycol (MIRALAX / GLYCOLAX) packet Take 17 g by mouth daily.    . promethazine (PHENERGAN) 25 MG tablet Take 25 mg by mouth every 6 (six) hours as needed for nausea.    Marland Kitchen saccharomyces boulardii (FLORASTOR) 250 MG capsule Take 250 mg by mouth as needed.     .  valACYclovir (VALTREX) 500 MG tablet TAKE ONE TABLET BY MOUTH ONCE DAILY 90 tablet 3  . Zinc 100 MG TABS Take one tablet daily    . [DISCONTINUED] zolpidem (AMBIEN) 10 MG tablet Take 5 mg by mouth at bedtime as needed.       No current facility-administered medications for this visit.    Allergies:   Thorazine   Social History:  The patient  reports that she quit smoking about 14 years ago. Her smoking use included Cigarettes. She has a 70 pack-year smoking history. She has never used smokeless tobacco. She reports that she drinks alcohol. She reports that she does not use illicit drugs.   Family History:  The patient's  family history includes Asthma in her mother; Heart attack in her father; Heart disease in her father and mother; Hypertension in her father and mother; Hypothyroidism in her mother.    ROS:  Please see the history of present illness.  Otherwise, review of systems is positive for shortness of breath.  All other systems are reviewed and negative.    PHYSICAL EXAM: VS:  BP 130/72 mmHg  Pulse 66  Ht 5\' 3"  (1.6 m)  Wt 131 lb (59.421 kg)  BMI 23.21 kg/m2 , BMI Body mass index is 23.21 kg/(m^2). GEN: Well nourished, well developed, in no acute distress HEENT: normal Neck: no JVD, carotid bruits, or masses Cardiac: RRR; no murmurs, rubs, or gallops,no edema  Respiratory:  clear to auscultation bilaterally, normal work of breathing GI: soft, nontender, nondistended, + BS MS: no deformity or atrophy Skin: warm and dry, no rash Neuro:  Strength and sensation are intact Psych: euthymic mood, full affect  EKG:  EKG is ordered today. The ekg ordered today shows normal sinus rhythm with incomplete left bundle branch block pattern  Recent Labs: 09/13/2014: ALT 21; BUN 17; Creatinine 0.83; Hemoglobin 14.8; Platelets 223; Potassium 4.0; Sodium 141; TSH 3.464   Lipid Panel     Component Value Date/Time   CHOL 352* 09/13/2014 1032   TRIG 174* 09/13/2014 1032   HDL 64  09/13/2014 1032   CHOLHDL 5.5 09/13/2014 1032   VLDL 35 09/13/2014 1032   LDLCALC 253* 09/13/2014 1032      Wt Readings from Last 3 Encounters:  12/01/14 131 lb (59.421 kg)  11/10/14 131 lb 6.4 oz (59.603 kg)  10/04/14 134 lb 8 oz (61.009 kg)     ASSESSMENT AND PLAN: 1.  CAD, nonobstructive, without angina. Doing well at present  - meds reviewed  and will continue same. We discussed reducing her ASA dose but she prefers to continue with same as it provides an analgesic effect for her.   2. HTN, essential. BP well-controlled with weight loss and current med Rx.  3. Hyperlipidemia: took a holiday from her statin because of myalgias but cholesterol got very high. Back on Rx with statin, CoQ10 and tolerating this.   Current medicines are reviewed with the patient today.  The patient does not have concerns regarding medicines.  The following changes have been made:  no change  Labs/ tests ordered today include: none   No orders of the defined types were placed in this encounter.    Disposition:   FU with me in 12 months  Signed, Sherren Mocha, MD  12/01/2014 12:07 PM    [address], [place], [place]  [address] Phone: [phone]; Fax: [phone]

## 2014-12-13 ENCOUNTER — Other Ambulatory Visit: Payer: Self-pay | Admitting: *Deleted

## 2014-12-13 MED ORDER — HYDROCHLOROTHIAZIDE 25 MG PO TABS: 25.0000 mg | ORAL_TABLET | Freq: Every day | ORAL | Status: DC

## 2014-12-13 NOTE — Telephone Encounter (Signed)
Refill sent for hydrochlorot 25mg 

## 2015-01-02 ENCOUNTER — Encounter: Payer: Self-pay | Admitting: Internal Medicine
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
## 2015-01-02 NOTE — Patient Instructions (Signed)
Continue same medications and return in 6 months. Need to resume Crestor.

## 2015-01-10 ENCOUNTER — Other Ambulatory Visit: Payer: Self-pay | Admitting: *Deleted

## 2015-01-10 ENCOUNTER — Other Ambulatory Visit: Payer: Self-pay | Admitting: Internal Medicine

## 2015-01-10 ENCOUNTER — Telehealth: Payer: Self-pay | Admitting: Internal Medicine

## 2015-01-10 MED ORDER — ROSUVASTATIN CALCIUM 20 MG PO TABS
20.0000 mg | ORAL_TABLET | Freq: Every day | ORAL | Status: DC
Start: 2015-01-10 — End: 2015-01-10

## 2015-01-10 MED ORDER — ROSUVASTATIN CALCIUM 20 MG PO TABS: 20.0000 mg | ORAL_TABLET | Freq: Every day | ORAL | Status: DC

## 2015-01-10 MED ORDER — SPIRIVA HANDIHALER 18 MCG IN CAPS
ORAL_CAPSULE | RESPIRATORY_TRACT | Status: DC
Start: 2015-01-10 — End: 2015-01-10

## 2015-01-10 MED ORDER — SPIRIVA HANDIHALER 18 MCG IN CAPS
ORAL_CAPSULE | RESPIRATORY_TRACT | Status: DC
Start: 2015-01-10 — End: 2015-10-14

## 2015-01-10 MED ORDER — MOMETASONE FURO-FORMOTEROL FUM 100-5 MCG/ACT IN AERO: INHALATION_SPRAY | RESPIRATORY_TRACT | Status: DC

## 2015-01-10 NOTE — Telephone Encounter (Signed)
We got a refill for these from [facility]. What is going on? I refilled them all

## 2015-01-10 NOTE — Telephone Encounter (Signed)
Printed scripts for [employer] and [employer]

## 2015-01-10 NOTE — Telephone Encounter (Signed)
Patient states that she will be going on Medicare in June.  Her husband would like a written prescription for the following prescriptions for her because he has some sort of savings card for these medications.  In order to get the savings card for these medications, he needs a written script for them.  He would like to have a years worth on the script please.  Spriva handihaler 18 mcg Dulera 100-5 mcg Crestor 20 mg  I will be happy to mail these prescriptions to the patient at her request please.  Her husband has to hand carry these Rx's to the pharmacy in order to receive the savings card.

## 2015-01-10 NOTE — Telephone Encounter (Signed)
Reprinted crestor and spiriva first time didn't print

## 2015-02-01 ENCOUNTER — Other Ambulatory Visit: Payer: Self-pay | Admitting: Internal Medicine

## 2015-02-07 ENCOUNTER — Other Ambulatory Visit: Payer: Self-pay | Admitting: *Deleted

## 2015-02-07 MED ORDER — BUTALBITAL-ASA-CAFFEINE 50-325-40 MG PO CAPS: ORAL_CAPSULE | ORAL | Status: DC

## 2015-02-07 NOTE — Telephone Encounter (Signed)
Refills on fiorinal faxed to pharmacy by Dr Renold Genta

## 2015-02-24 ENCOUNTER — Telehealth: Payer: Self-pay | Admitting: Internal Medicine

## 2015-02-24 ENCOUNTER — Other Ambulatory Visit: Payer: Self-pay | Admitting: *Deleted

## 2015-02-24 NOTE — Telephone Encounter (Signed)
Please print this up as requested

## 2015-02-24 NOTE — Telephone Encounter (Signed)
Patient calls requesting PAPER Rx for Vagifem 25 mcg.  She would like to know if you will CHANGE the Rx to taking 1 EVERY day so that the script will last longer.  She is having to pay shipping charges.  She will continue to take the prescription AS YOU HAVE pRESCRIBED it EVERY 3 days NOT DAILY.  However, if it is written for EVERY day, the script will last longer.  And, she wants to know if you can write it for 90 days (or 3 months) so that it will last longer since she is having to pay shipping charges.  Advised we will try to have this ready by tomorrow for her to pick up.

## 2015-02-24 NOTE — Telephone Encounter (Signed)
Erroneous encounter opened.  Notes made on another encounter.

## 2015-03-18 ENCOUNTER — Other Ambulatory Visit: Payer: Self-pay | Admitting: *Deleted

## 2015-03-18 MED ORDER — PANTOPRAZOLE SODIUM 40 MG PO TBEC: 40.0000 mg | DELAYED_RELEASE_TABLET | Freq: Two times a day (BID) | ORAL | Status: DC

## 2015-03-18 NOTE — Telephone Encounter (Signed)
protonix refill sent to patient pharmacy

## 2015-03-29 ENCOUNTER — Other Ambulatory Visit (INDEPENDENT_AMBULATORY_CARE_PROVIDER_SITE_OTHER): Payer: BLUE CROSS/BLUE SHIELD | Admitting: Internal Medicine

## 2015-03-29 DIAGNOSIS — E785 Hyperlipidemia, unspecified: Secondary | ICD-10-CM

## 2015-03-29 DIAGNOSIS — E119 Type 2 diabetes mellitus without complications: Secondary | ICD-10-CM

## 2015-03-29 DIAGNOSIS — R350 Frequency of micturition: Secondary | ICD-10-CM

## 2015-03-29 DIAGNOSIS — Z79899 Other long term (current) drug therapy: Secondary | ICD-10-CM

## 2015-03-29 LAB — HEMOGLOBIN A1C
Hgb A1c MFr Bld: 5.7 % — ABNORMAL HIGH (ref <5.7)
Mean Plasma Glucose: 117 mg/dL — ABNORMAL HIGH (ref <117)

## 2015-03-29 LAB — POCT URINALYSIS DIPSTICK
Bilirubin, UA: NEGATIVE
Blood, UA: NEGATIVE
Clarity, UA: NEGATIVE
Glucose, UA: NEGATIVE
Ketones, UA: NEGATIVE
Leukocytes, UA: NEGATIVE
Nitrite, UA: NEGATIVE
Protein, UA: NEGATIVE
Spec Grav, UA: <=1.005
Urobilinogen, UA: NEGATIVE
pH, UA: 6

## 2015-03-29 LAB — LIPID PANEL
Cholesterol: 180 mg/dL (ref 0–200)
HDL: 65 mg/dL (ref >=46)
LDL Cholesterol: 96 mg/dL (ref 0–99)
Total CHOL/HDL Ratio: 2.8 Ratio
Triglycerides: 96 mg/dL (ref <150)
VLDL: 19 mg/dL (ref 0–40)

## 2015-03-29 LAB — HEPATIC FUNCTION PANEL
ALT: 23 U/L (ref 0–35)
AST: 23 U/L (ref 0–37)
Albumin: 4.2 g/dL (ref 3.5–5.2)
Alkaline Phosphatase: 65 U/L (ref 39–117)
Bilirubin, Direct: 0.1 mg/dL (ref 0.0–0.3)
Indirect Bilirubin: 0.3 mg/dL (ref 0.2–1.2)
Total Bilirubin: 0.4 mg/dL (ref 0.2–1.2)
Total Protein: 6.3 g/dL (ref 6.0–8.3)

## 2015-03-29 NOTE — Progress Notes (Signed)
Patient c/o urinary frequency urinalysis normal.

## 2015-03-30 LAB — MICROALBUMIN / CREATININE URINE RATIO
Creatinine, Urine: 136.3 mg/dL
Microalb Creat Ratio: 11.7 mg/g (ref 0.0–30.0)
Microalb, Ur: 1.6 mg/dL (ref <2.0)

## 2015-03-31 ENCOUNTER — Encounter: Payer: Self-pay | Admitting: Internal Medicine

## 2015-03-31 ENCOUNTER — Ambulatory Visit (INDEPENDENT_AMBULATORY_CARE_PROVIDER_SITE_OTHER): Payer: BLUE CROSS/BLUE SHIELD | Admitting: Internal Medicine

## 2015-03-31 VITALS — BP 126/70 | HR 75 | Temp 98.2°F | Wt 129.0 lb

## 2015-03-31 DIAGNOSIS — Z0000 Encounter for general adult medical examination without abnormal findings: Secondary | ICD-10-CM | POA: Diagnosis not present

## 2015-04-04 ENCOUNTER — Encounter: Payer: Self-pay | Admitting: Internal Medicine

## 2015-04-04 NOTE — Progress Notes (Signed)
   Subjective:    Patient ID: [name], female    DOB: [birth year], 64 y.o.   MRN: [id number]  HPI  64 year old White Female in today for six-month recheck appointment. History of essential hypertension, coronary artery disease, hyperlipidemia, COPD requiring home oxygen therapy. She's been doing quite well. Hasn't had any significant respiratory infections recently. History of recurrent urinary tract infections. No UTIs recently either. At last visit in November, she had stopped her statin medication thinking it was causing musculoskeletal pain. Total cholesterol worsened at 352 with triglycerides of 172 and LDL cholesterol of 253. She is now back on statin medication and lipids have improved considerably . Total cholesterol was 180, LDL cholesterol 96, triglycerides 96. Hemoglobin A1c stable at 5.7 and previously was 5.7% in November. She is taking coenzyme Q which helps musculoskeletal pain. Urinalysis today is completely normal. She's been doing some gardening and feels well. Has also been showing cats again.        Review of Systems     Objective:   Physical Exam   Skin warm and dry. Nodes none. Chest clear to auscultation without rales or wheezing. Cardiac exam regular rate and rhythm normal S1 and S2. Extremities without edema.      Assessment & Plan:   Overall she's doing quite well and looks great .  Hyperlipidemia-stable on statin medication and coenzyme Q  Essential hypertension-blood pressure normal today  COPD-remains on home oxygen therapy and stable  Impaired glucose tolerance-she will have an A1c stable at 5.7%  Plan: She will be going on Medicare this summer. We'll wait and get her mammogram after she goes on Medicare. She will get welcome to Medicare exam in December.

## 2015-04-04 NOTE — Patient Instructions (Signed)
It was a pleasure to see you today. Please continue same medications and return in December for welcome to Medicare physical exam. Have mammogram after you go on Medicare.

## 2015-04-13 ENCOUNTER — Other Ambulatory Visit: Payer: Self-pay | Admitting: Internal Medicine

## 2015-04-14 ENCOUNTER — Other Ambulatory Visit: Payer: Self-pay | Admitting: *Deleted

## 2015-04-14 MED ORDER — PANTOPRAZOLE SODIUM 40 MG PO TBEC: 40.0000 mg | DELAYED_RELEASE_TABLET | Freq: Two times a day (BID) | ORAL | Status: DC

## 2015-04-14 MED ORDER — HYDROCHLOROTHIAZIDE 25 MG PO TABS: 25.0000 mg | ORAL_TABLET | Freq: Every day | ORAL | Status: DC

## 2015-04-14 MED ORDER — LOSARTAN POTASSIUM 25 MG PO TABS: 25.0000 mg | ORAL_TABLET | Freq: Every day | ORAL | Status: DC

## 2015-04-14 MED ORDER — ATENOLOL 25 MG PO TABS: 25.0000 mg | ORAL_TABLET | Freq: Every day | ORAL | Status: DC

## 2015-04-19 ENCOUNTER — Encounter: Payer: Self-pay | Admitting: Internal Medicine

## 2015-04-19 ENCOUNTER — Ambulatory Visit (INDEPENDENT_AMBULATORY_CARE_PROVIDER_SITE_OTHER): Payer: Medicare Other | Admitting: Internal Medicine

## 2015-04-19 VITALS — BP 124/74 | HR 81 | Temp 98.0°F | Wt 129.0 lb

## 2015-04-19 DIAGNOSIS — R350 Frequency of micturition: Secondary | ICD-10-CM

## 2015-04-19 DIAGNOSIS — I2510 Atherosclerotic heart disease of native coronary artery without angina pectoris: Secondary | ICD-10-CM | POA: Diagnosis not present

## 2015-04-19 DIAGNOSIS — R8299 Other abnormal findings in urine: Secondary | ICD-10-CM | POA: Diagnosis not present

## 2015-04-19 DIAGNOSIS — R8290 Unspecified abnormal findings in urine: Secondary | ICD-10-CM

## 2015-04-19 LAB — POCT URINALYSIS DIPSTICK
Bilirubin, UA: NEGATIVE
Blood, UA: NEGATIVE
Glucose, UA: NEGATIVE
Nitrite, UA: POSITIVE
Protein, UA: NEGATIVE
Spec Grav, UA: <=1.005
Urobilinogen, UA: NEGATIVE
pH, UA: 5

## 2015-04-19 NOTE — Progress Notes (Signed)
   Subjective:    Patient ID: [name], female    DOB: Sep 11, 1951, 64 y.o.   MRN: [id number]  HPI Last UTI  was in November 2015. Organism was Klebsiella pneumoniae sensitive to Cipro. She has a long-standing history of recurrent urinary tract infections that have improved in terms of frequency over the past couple of years. In 2012 she had beta-lactam producing Escherichia coli urinary tract infection(ESBL) that required fosfomycin therapy. She had onset of symptoms on June 12 after attending a cat show in [place]. Has had frequency and dysuria. No hematuria. No fever or shaking chills. Slight back pain. No nausea and vomiting.    Review of Systems     Objective:   Physical Exam  No CVA tenderness. Urinalysis is very abnormal. Culture sent. Also has a lesion on her right arm and her right leg that she is concerned about. One lesion has some scale to it and is slightly papular on her arm. The other lesion is more macular about the size of a nickel on her right anterior leg      Assessment & Plan:  Acute urinary tract infection  History of beta-lactam producing Escherichia coli urinary tract infection 2012  Skin lesions-obtain dermatology appointment  Plan: She is comfortable waiting culture results. She will take Azo-Standard for symptoms.

## 2015-04-19 NOTE — Patient Instructions (Signed)
Await urine culture results. Dermatology appointment to be obtained. It was a pleasure to see you today.

## 2015-04-22 ENCOUNTER — Telehealth: Payer: Self-pay | Admitting: *Deleted

## 2015-04-22 LAB — URINE CULTURE: Colony Count: >=100000

## 2015-04-22 MED ORDER — CIPROFLOXACIN HCL 500 MG PO TABS: 500.0000 mg | ORAL_TABLET | Freq: Two times a day (BID) | ORAL | Status: DC

## 2015-04-22 MED ORDER — PHENAZOPYRIDINE HCL 200 MG PO TABS: 200.0000 mg | ORAL_TABLET | Freq: Three times a day (TID) | ORAL | Status: DC | PRN

## 2015-04-22 NOTE — Telephone Encounter (Signed)
Spoke with patient regarding lab results scripts sent for Cipro and pyridium per Dr Renold Genta

## 2015-05-02 ENCOUNTER — Other Ambulatory Visit: Payer: Self-pay

## 2015-06-21 ENCOUNTER — Telehealth: Payer: Self-pay | Admitting: *Deleted

## 2015-06-21 MED ORDER — BUTALBITAL-ASA-CAFFEINE 50-325-40 MG PO CAPS: ORAL_CAPSULE | ORAL | Status: DC

## 2015-06-21 NOTE — Telephone Encounter (Signed)
Spoke with patient regarding use of Fioricet patient states she has taken it for shoulder pain, bladder pain, and migraines. She states she would prefer  Using fioricet instead of oxycodone for pain. Patient does not want to do physical therapy for shoulder pain. Will refill patients fioricet per Dr Renold Genta

## 2015-07-04 ENCOUNTER — Other Ambulatory Visit: Payer: Self-pay | Admitting: *Deleted

## 2015-07-04 MED ORDER — VALACYCLOVIR HCL 500 MG PO TABS: ORAL_TABLET | ORAL | Status: DC

## 2015-07-05 ENCOUNTER — Telehealth: Payer: Self-pay | Admitting: Internal Medicine

## 2015-07-05 NOTE — Telephone Encounter (Signed)
Refilled generic Valtrex 500 mg daily prn one year to [facility]. This is second request. This was also done yesterday.

## 2015-08-07 ENCOUNTER — Other Ambulatory Visit: Payer: Self-pay | Admitting: Internal Medicine

## 2015-08-16 ENCOUNTER — Ambulatory Visit (INDEPENDENT_AMBULATORY_CARE_PROVIDER_SITE_OTHER): Payer: Medicare Other | Admitting: Internal Medicine

## 2015-08-16 DIAGNOSIS — Z23 Encounter for immunization: Secondary | ICD-10-CM | POA: Diagnosis not present

## 2015-08-21 ENCOUNTER — Other Ambulatory Visit: Payer: Self-pay | Admitting: Internal Medicine

## 2015-09-02 ENCOUNTER — Other Ambulatory Visit: Payer: Self-pay | Admitting: Internal Medicine

## 2015-09-16 ENCOUNTER — Other Ambulatory Visit: Payer: Medicare Other | Admitting: Internal Medicine

## 2015-09-16 DIAGNOSIS — Z85528 Personal history of other malignant neoplasm of kidney: Secondary | ICD-10-CM

## 2015-09-16 DIAGNOSIS — Z1329 Encounter for screening for other suspected endocrine disorder: Secondary | ICD-10-CM | POA: Diagnosis not present

## 2015-09-16 DIAGNOSIS — Z1321 Encounter for screening for nutritional disorder: Secondary | ICD-10-CM

## 2015-09-16 DIAGNOSIS — E785 Hyperlipidemia, unspecified: Secondary | ICD-10-CM

## 2015-09-16 DIAGNOSIS — Z130 Encounter for screening for diseases of the blood and blood-forming organs and certain disorders involving the immune mechanism: Secondary | ICD-10-CM

## 2015-09-16 DIAGNOSIS — Z0000 Encounter for general adult medical examination without abnormal findings: Secondary | ICD-10-CM | POA: Diagnosis not present

## 2015-09-16 DIAGNOSIS — I10 Essential (primary) hypertension: Secondary | ICD-10-CM | POA: Diagnosis not present

## 2015-09-16 LAB — COMPLETE METABOLIC PANEL WITH GFR
ALT: 41 U/L — ABNORMAL HIGH (ref 6–29)
AST: 31 U/L (ref 10–35)
Albumin: 4.2 g/dL (ref 3.6–5.1)
Alkaline Phosphatase: 59 U/L (ref 33–130)
BUN: 18 mg/dL (ref 7–25)
CO2: 27 mmol/L (ref 20–31)
Calcium: 9.4 mg/dL (ref 8.6–10.4)
Chloride: 98 mmol/L (ref 98–110)
Creat: 0.79 mg/dL (ref 0.50–0.99)
GFR, Est African American: >89 mL/min (ref >=60)
GFR, Est Non African American: 79 mL/min (ref >=60)
Glucose, Bld: 87 mg/dL (ref 65–99)
Potassium: 4.2 mmol/L (ref 3.5–5.3)
Sodium: 137 mmol/L (ref 135–146)
Total Bilirubin: 0.6 mg/dL (ref 0.2–1.2)
Total Protein: 6.4 g/dL (ref 6.1–8.1)

## 2015-09-16 LAB — CBC WITH DIFFERENTIAL/PLATELET
Basophils Absolute: 0.1 10*3/uL (ref 0.0–0.1)
Basophils Relative: 1 % (ref 0–1)
Eosinophils Absolute: 0.2 10*3/uL (ref 0.0–0.7)
Eosinophils Relative: 4 % (ref 0–5)
HCT: 42.9 % (ref 36.0–46.0)
Hemoglobin: 14.2 g/dL (ref 12.0–15.0)
Lymphocytes Relative: 30 % (ref 12–46)
Lymphs Abs: 1.7 10*3/uL (ref 0.7–4.0)
MCH: 31.2 pg (ref 26.0–34.0)
MCHC: 33.1 g/dL (ref 30.0–36.0)
MCV: 94.3 fL (ref 78.0–100.0)
MPV: 9 fL (ref 8.6–12.4)
Monocytes Absolute: 0.4 10*3/uL (ref 0.1–1.0)
Monocytes Relative: 8 % (ref 3–12)
Neutro Abs: 3.2 10*3/uL (ref 1.7–7.7)
Neutrophils Relative %: 57 % (ref 43–77)
Platelets: 206 10*3/uL (ref 150–400)
RBC: 4.55 MIL/uL (ref 3.87–5.11)
RDW: 13.5 % (ref 11.5–15.5)
WBC: 5.6 10*3/uL (ref 4.0–10.5)

## 2015-09-16 LAB — LIPID PANEL
Cholesterol: 204 mg/dL — ABNORMAL HIGH (ref 125–200)
HDL: 85 mg/dL (ref >=46)
LDL Cholesterol: 95 mg/dL (ref <130)
Total CHOL/HDL Ratio: 2.4 Ratio (ref <=5.0)
Triglycerides: 121 mg/dL (ref <150)
VLDL: 24 mg/dL (ref <30)

## 2015-09-16 LAB — TSH: TSH: 2.285 u[IU]/mL (ref 0.350–4.500)

## 2015-10-07 ENCOUNTER — Encounter: Payer: BLUE CROSS/BLUE SHIELD | Admitting: Internal Medicine

## 2015-10-14 ENCOUNTER — Ambulatory Visit (INDEPENDENT_AMBULATORY_CARE_PROVIDER_SITE_OTHER): Payer: Medicare Other | Admitting: Internal Medicine

## 2015-10-14 ENCOUNTER — Encounter: Payer: Self-pay | Admitting: Internal Medicine

## 2015-10-14 VITALS — Ht 63.0 in | Wt 134.5 lb

## 2015-10-14 DIAGNOSIS — Z8669 Personal history of other diseases of the nervous system and sense organs: Secondary | ICD-10-CM

## 2015-10-14 DIAGNOSIS — R7401 Elevation of levels of liver transaminase levels: Secondary | ICD-10-CM

## 2015-10-14 DIAGNOSIS — Z9981 Dependence on supplemental oxygen: Secondary | ICD-10-CM

## 2015-10-14 DIAGNOSIS — Z0000 Encounter for general adult medical examination without abnormal findings: Secondary | ICD-10-CM | POA: Diagnosis not present

## 2015-10-14 DIAGNOSIS — K219 Gastro-esophageal reflux disease without esophagitis: Secondary | ICD-10-CM | POA: Diagnosis not present

## 2015-10-14 DIAGNOSIS — Z8709 Personal history of other diseases of the respiratory system: Secondary | ICD-10-CM

## 2015-10-14 DIAGNOSIS — I10 Essential (primary) hypertension: Secondary | ICD-10-CM | POA: Diagnosis not present

## 2015-10-14 DIAGNOSIS — G4700 Insomnia, unspecified: Secondary | ICD-10-CM | POA: Diagnosis not present

## 2015-10-14 DIAGNOSIS — N390 Urinary tract infection, site not specified: Secondary | ICD-10-CM

## 2015-10-14 DIAGNOSIS — E785 Hyperlipidemia, unspecified: Secondary | ICD-10-CM | POA: Diagnosis not present

## 2015-10-14 DIAGNOSIS — R740 Nonspecific elevation of levels of transaminase and lactic acid dehydrogenase [LDH]: Secondary | ICD-10-CM | POA: Diagnosis not present

## 2015-10-14 LAB — POCT URINALYSIS DIPSTICK
Bilirubin, UA: NEGATIVE
Blood, UA: NEGATIVE
Glucose, UA: NEGATIVE
Ketones, UA: NEGATIVE
Leukocytes, UA: NEGATIVE
Nitrite, UA: NEGATIVE
Protein, UA: NEGATIVE
Spec Grav, UA: 1.02
Urobilinogen, UA: NEGATIVE
pH, UA: 6

## 2015-10-19 ENCOUNTER — Telehealth: Payer: Self-pay

## 2015-10-19 DIAGNOSIS — Z0000 Encounter for general adult medical examination without abnormal findings: Secondary | ICD-10-CM

## 2015-10-19 DIAGNOSIS — R413 Other amnesia: Secondary | ICD-10-CM

## 2015-10-19 NOTE — Telephone Encounter (Signed)
Referrals entered for dermatology and scheduled for Jan 24th @ 9:30 with an arrival time of 9:15. Left patient a voicemail message letting her know address of [address], [place], [place] [address] and a contact # of [phone]. Patient advised if unable to keep appointment she can call their office to reschedule. Dr. Valentina Shaggy office will contact patient to schedule their appointment for memory loss.

## 2015-10-19 NOTE — Telephone Encounter (Signed)
Patient needs to be seen at dermatology for skin exam and by Dr. Valentina Shaggy for memory loss per Dr. Renold Genta requests

## 2015-10-21 ENCOUNTER — Ambulatory Visit (INDEPENDENT_AMBULATORY_CARE_PROVIDER_SITE_OTHER): Payer: Medicare Other | Admitting: Internal Medicine

## 2015-10-21 DIAGNOSIS — I2510 Atherosclerotic heart disease of native coronary artery without angina pectoris: Secondary | ICD-10-CM | POA: Diagnosis not present

## 2015-10-21 DIAGNOSIS — E559 Vitamin D deficiency, unspecified: Secondary | ICD-10-CM | POA: Diagnosis not present

## 2015-10-21 DIAGNOSIS — Z23 Encounter for immunization: Secondary | ICD-10-CM

## 2015-10-22 LAB — VITAMIN D 25 HYDROXY (VIT D DEFICIENCY, FRACTURES): Vit D, 25-Hydroxy: 60 ng/mL (ref 30–100)

## 2015-10-31 ENCOUNTER — Encounter: Payer: Self-pay | Admitting: Internal Medicine

## 2015-10-31 NOTE — Progress Notes (Signed)
   Subjective:    Patient ID: [name], female    DOB: 05/22/1951, 64 y.o.   MRN: [id number]  HPI Immunization visit. Not seen by M.D. Vitamin D level drawn.    Review of Systems     Objective:   Physical Exam        Assessment & Plan:

## 2015-10-31 NOTE — Patient Instructions (Signed)
Prevnar 13 given today. Call if you have questions.

## 2015-11-21 ENCOUNTER — Telehealth: Payer: Self-pay | Admitting: Internal Medicine

## 2015-11-21 NOTE — Telephone Encounter (Signed)
Has come down with respiratory infection. Has cough and congestion. No fever. Has Levaquin and prednisone on hand at home and may start Levaquin 500 mg daily for 10 days and prednisone 10 mg tablets  going from 60 mg to 0 mg over 7 days.

## 2015-11-24 ENCOUNTER — Telehealth: Payer: Self-pay | Admitting: Internal Medicine

## 2015-11-24 ENCOUNTER — Telehealth: Payer: Self-pay

## 2015-11-24 MED ORDER — FLUCONAZOLE 150 MG PO TABS: 150.0000 mg | ORAL_TABLET | Freq: Once | ORAL | Status: DC

## 2015-11-24 NOTE — Telephone Encounter (Signed)
Prescription sent to pharmacy and patient notified.  

## 2015-11-24 NOTE — Telephone Encounter (Signed)
Diflucan 150 mg tablet with one refill. Repeat in 5 days if no better

## 2015-11-24 NOTE — Telephone Encounter (Signed)
Had to phone pharmacy again as pt said prescription was not available. Pharmacist says he has just now pulled voice mail off and will be filling the Rx. Rx was called in at 3:29pm according to EPIC.

## 2015-11-24 NOTE — Telephone Encounter (Signed)
Patient states that she spoke to you over the weekend and she is taking the antibiotics and the prednisone and she now has thrush. Please advise on what to do.

## 2015-11-25 NOTE — Telephone Encounter (Signed)
This was not phoned in; this was e-scribed at 3:28pm according to receipt confirmed by pharmacy. Not sure what voicemail was retrieved but it was not for this patient. Is there something else you need me to do?

## 2015-11-25 NOTE — Telephone Encounter (Signed)
I called and spoke with pharmacist.

## 2015-11-29 DIAGNOSIS — L814 Other melanin hyperpigmentation: Secondary | ICD-10-CM | POA: Diagnosis not present

## 2015-11-29 DIAGNOSIS — D225 Melanocytic nevi of trunk: Secondary | ICD-10-CM | POA: Diagnosis not present

## 2015-11-29 DIAGNOSIS — L720 Epidermal cyst: Secondary | ICD-10-CM | POA: Diagnosis not present

## 2015-11-29 DIAGNOSIS — I788 Other diseases of capillaries: Secondary | ICD-10-CM | POA: Diagnosis not present

## 2015-11-29 DIAGNOSIS — L821 Other seborrheic keratosis: Secondary | ICD-10-CM | POA: Diagnosis not present

## 2015-12-06 ENCOUNTER — Other Ambulatory Visit: Payer: Self-pay | Admitting: Internal Medicine

## 2015-12-06 NOTE — Telephone Encounter (Signed)
Phoned to pharmacy 

## 2015-12-06 NOTE — Telephone Encounter (Signed)
Refill to [id number] with one refill

## 2015-12-28 ENCOUNTER — Other Ambulatory Visit: Payer: Self-pay | Admitting: Internal Medicine

## 2016-01-01 NOTE — Patient Instructions (Addendum)
It was a pleasure to see you today. Return in 6 months. You will have fasting lab work at that time. Elevation of SGPT is very mild and will be repeated at that time. Continue same medications.
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
## 2016-01-04 ENCOUNTER — Encounter: Payer: Self-pay | Admitting: Psychology

## 2016-01-04 ENCOUNTER — Ambulatory Visit: Payer: Medicare Other | Attending: Internal Medicine | Admitting: Psychology

## 2016-01-04 DIAGNOSIS — R413 Other amnesia: Secondary | ICD-10-CM | POA: Insufficient documentation
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
## 2016-01-12 ENCOUNTER — Other Ambulatory Visit: Payer: Self-pay

## 2016-01-12 ENCOUNTER — Telehealth: Payer: Self-pay | Admitting: Internal Medicine

## 2016-01-12 MED ORDER — FLUCONAZOLE 150 MG PO TABS: 150.0000 mg | ORAL_TABLET | Freq: Once | ORAL | Status: DC

## 2016-01-12 NOTE — Telephone Encounter (Signed)
Call in Diflucan 150 mg tab with 2 refills: Directions: one po as needed for stomatitis

## 2016-01-12 NOTE — Telephone Encounter (Signed)
States that she is having the spots and break out in her mouth again.  Wants to know if you'll call in Diflucan for her again please?  Pharmacy:  [facility] [place]

## 2016-02-03 ENCOUNTER — Ambulatory Visit (INDEPENDENT_AMBULATORY_CARE_PROVIDER_SITE_OTHER): Payer: Medicare Other | Admitting: Cardiovascular Disease

## 2016-02-03 ENCOUNTER — Encounter: Payer: Self-pay | Admitting: Cardiovascular Disease

## 2016-02-03 VITALS — BP 120/70 | HR 72 | Ht 62.5 in | Wt 131.8 lb

## 2016-02-03 DIAGNOSIS — I2510 Atherosclerotic heart disease of native coronary artery without angina pectoris: Secondary | ICD-10-CM

## 2016-02-03 NOTE — Patient Instructions (Signed)

## 2016-02-03 NOTE — Progress Notes (Signed)
Cardiology Office Note Date:  02/03/2016   ID:  [name], DOB [birth year], MRN [id number]  PCP:  Elby Showers, MD  Cardiologist:  Sherren Mocha, MD    Chief Complaint  Patient presents with  . Coronary Artery Disease    denies cp, sob, le edema, or claudication     History of Present Illness: [name] is a 65 y.o. female who presents for Follow-up evaluation. She was last seen in January 2016. She's been followed for nonobstructive coronary artery disease after undergoing cardiac catheterization in 2008 demonstrating mild plaque in the mid LAD and mild to moderate stenosis of the left circumflex. LV function is normal. The patient is on home O2. She maintains a reasonably active lifestyle. She denies chest pain, chest pressure, edema, orthopnea, or PND. She does admit to shortness of breath with activity but this is unchanged. She's a [employer] alum and fan of the [employer] football team.   Past Medical History  Diagnosis Date  . Renal cell cancer ([place])     (Lt) partial nephrectomy dx 11/06  . Emphysema   . Hypertension   . Chest pain   . Dyspnea   . CAD (coronary artery disease)   . Hyperlipidemia   . Headache(784.0)   . Goiter   . COPD (chronic obstructive pulmonary disease) ([place])   . Asthma   . Allergic rhinitis     Past Surgical History  Procedure Laterality Date  . Cesarean section      x 3  . Nephrectomy      partial  . Uretheral re-implantation      Current Outpatient Prescriptions  Medication Sig Dispense Refill  . ascorbic acid (VITAMIN C) 1000 MG tablet Take 1,000 mg by mouth daily.    . Aspirin-Acetaminophen-Caffeine (EXCEDRIN PO) Take 1 tablet by mouth daily as needed (HEADACHE).     Marland Kitchen atenolol (TENORMIN) 25 MG tablet Take 25 mg by mouth daily.    . Butalbital-APAP-Caffeine 50-325-40 MG capsule TAKE ONE TO TWO CAPSULES BY MOUTH EVERY 6 HOURS AS NEEDED FOR HEADACHE 60 capsule 1  . cetirizine (ZYRTEC) 10 MG tablet Take 10 mg by mouth daily.      . cholecalciferol (VITAMIN D) 1000 UNITS tablet Take 1,000 Units by mouth daily.    . Coenzyme Q10 (COQ10) 100 MG CAPS Take 1 tablet by mouth daily.      . CRESTOR 20 MG tablet TAKE ONE TABLET BY MOUTH ONCE DAILY 90 tablet 1  . diphenhydrAMINE (BENADRYL) 25 MG tablet Take 25-50 mg by mouth every 6 (six) hours as needed for itching or allergies.     Marland Kitchen estradiol (VAGIFEM) 25 MCG vaginal tablet Place 25 mcg vaginally every 3 (three) days.     . Estradiol 10 MCG TABS vaginal tablet Place vaginally.    . fish oil-omega-3 fatty acids 1000 MG capsule Take 1 capsule by mouth daily.      . fluconazole (DIFLUCAN) 150 MG tablet Take 1 tablet (150 mg total) by mouth once. Repeat in 5 days if no better 1 tablet 1  . GuaiFENesin (MUCINEX PO) Take 1 capsule by mouth daily as needed (loosen mucus).     . hydrochlorothiazide (HYDRODIURIL) 25 MG tablet Take 25 mg by mouth daily.    Marland Kitchen losartan (COZAAR) 25 MG tablet Take 25 mg by mouth daily.     . mometasone-formoterol (DULERA) 100-5 MCG/ACT AERO Inhale 2 puffs into the lungs daily.     . Multiple Vitamins-Minerals (CENTRUM SILVER  PO) Take 1 tablet by mouth daily. Take one daily    . nitroGLYCERIN (NITROSTAT) 0.4 MG SL tablet Place 0.4 mg under the tongue every 5 (five) minutes as needed for chest pain.     . NON FORMULARY Take 2 capsules by mouth every 6 (six) hours as needed ("Rootology" for sinuses).    . pantoprazole (PROTONIX) 40 MG tablet Take 40 mg by mouth 2 (two) times daily before a meal.     . polyethylene glycol (MIRALAX / GLYCOLAX) packet Take 17 g by mouth daily.    Marland Kitchen saccharomyces boulardii (FLORASTOR) 250 MG capsule Take 250 mg by mouth as needed.     . temazepam (RESTORIL) 15 MG capsule Take 15 mg by mouth at bedtime as needed for sleep.     Marland Kitchen tiotropium (SPIRIVA) 18 MCG inhalation capsule Place 18 mcg into inhaler and inhale daily.     . TURMERIC CURCUMIN PO Take 1 Dose by mouth daily.     . TURMERIC PO Take 1 capsule by mouth daily.    .  valACYclovir (VALTREX) 500 MG tablet TAKE ONE CAPLET BY MOUTH ONCE DAILY 90 tablet 1  . Zinc 100 MG TABS Take 1 tablet by mouth daily.     . [DISCONTINUED] zolpidem (AMBIEN) 10 MG tablet Take 5 mg by mouth at bedtime as needed.       No current facility-administered medications for this visit.    Allergies:   Nitrofurantoin macrocrystal and Thorazine   Social History:  The patient  reports that she quit smoking about 15 years ago. Her smoking use included Cigarettes. She has a 70 pack-year smoking history. She has never used smokeless tobacco. She reports that she drinks alcohol. She reports that she does not use illicit drugs.   Family History:  The patient's  family history includes Asthma in her mother; Heart attack in her father; Heart disease in her father and mother; Hypertension in her father and mother; Hypothyroidism in her mother.    ROS:  Please see the history of present illness.  Otherwise, review of systems is positive for headaches.  All other systems are reviewed and negative.    PHYSICAL EXAM: VS:  BP 120/70 mmHg  Pulse 72  Ht 5' 2.5" (1.588 m)  Wt 131 lb 12.8 oz (59.784 kg)  BMI 23.71 kg/m2 , BMI Body mass index is 23.71 kg/(m^2). GEN: Well nourished, well developed, in no acute distress HEENT: normal Neck: no JVD, no masses. No carotid bruits Cardiac: RRR without murmur or gallop                Respiratory:  clear to auscultation bilaterally, normal work of breathing GI: soft, nontender, nondistended, + BS MS: no deformity or atrophy Ext: no pretibial edema, pedal pulses 2+= bilaterally Skin: warm and dry, no rash Neuro:  Strength and sensation are intact Psych: euthymic mood, full affect  EKG:  EKG is not ordered today.  Recent Labs: 09/16/2015: ALT 41*; BUN 18; Creat 0.79; Hemoglobin 14.2; Platelets 206; Potassium 4.2; Sodium 137; TSH 2.285   Lipid Panel     Component Value Date/Time   CHOL 204* 09/16/2015 0948   TRIG 121 09/16/2015 0948   HDL 85  09/16/2015 0948   CHOLHDL 2.4 09/16/2015 0948   VLDL 24 09/16/2015 0948   LDLCALC 95 09/16/2015 0948      Wt Readings from Last 3 Encounters:  02/03/16 131 lb 12.8 oz (59.784 kg)  10/14/15 134 lb 8 oz (61.009 kg)  04/19/15  129 lb (58.514 kg)    ASSESSMENT AND PLAN: 1.  Coronary artery disease, native vessel, without angina: The patient is doing well on her current medical program. This will be continued without changes.  2. Essential hypertension: Blood pressure is well controlled. Medications are reviewed today.  3. Hyperlipidemia: The patient is tolerating Crestor along with coenzyme every 10. She took a statin holiday but her cholesterol was markedly elevated with this. I reviewed her lipid panels over the last several years.  Current medicines are reviewed with the patient today.  The patient does not have concerns regarding medicines.  Labs/ tests ordered today include:  No orders of the defined types were placed in this encounter.    Disposition:   FU one year  Signed, Sherren Mocha, MD  02/03/2016 4:42 PM    [facility] [place], [place], [place]  [address] Phone: [phone]; Fax: [phone]

## 2016-02-05 ENCOUNTER — Other Ambulatory Visit: Payer: Self-pay | Admitting: Internal Medicine

## 2016-02-27 ENCOUNTER — Telehealth: Payer: Self-pay | Admitting: Internal Medicine

## 2016-02-27 ENCOUNTER — Other Ambulatory Visit: Payer: Self-pay

## 2016-02-27 MED ORDER — CIPROFLOXACIN HCL 500 MG PO TABS: 500.0000 mg | ORAL_TABLET | Freq: Two times a day (BID) | ORAL | Status: DC

## 2016-02-27 MED ORDER — AMOXICILLIN-POT CLAVULANATE 500-125 MG PO TABS: 1.0000 | ORAL_TABLET | Freq: Three times a day (TID) | ORAL | Status: DC

## 2016-02-27 NOTE — Telephone Encounter (Signed)
crestor check on. She needs cipro 500mg  #14 1 po BID, augmentin 500mg  #30 TID for 10 days for Urine infection. Not refilled nitrofurantoin due to chest pain with

## 2016-02-27 NOTE — Telephone Encounter (Signed)
Going on 3 week trip to [place] leaving May 5. Needs refill on Crestor.  Looks like she has refills on Crestor through [facility]. Husband says she needs nitrofurantoin refilled but she has previously stated that call some chest pain. For urinary tract infection prescribed Augmentin 500 mg 3 times daily for 10 days to take on trip with her. Also for possible food poisoning Cipro 500 mg twice daily for 7 days.

## 2016-02-27 NOTE — Telephone Encounter (Signed)
Going on 3 week trip to [place] leaving May 5. Needs refill on Crestor but it looks like she has refills through [facility]. Nurse to check on that. Refill Cipro 500 mg twice daily for 7 days for possible food poisoning while on trip. For urinary tract infection Augmentin 500 mg 3 times daily for 10 days. Also asking for refill on nitrofurantoin  but previously has said she had reaction to that.

## 2016-02-28 ENCOUNTER — Telehealth: Payer: Self-pay | Admitting: Internal Medicine

## 2016-02-28 MED ORDER — ROSUVASTATIN CALCIUM 20 MG PO TABS: 20.0000 mg | ORAL_TABLET | Freq: Every day | ORAL | Status: DC

## 2016-02-28 MED ORDER — NITROFURANTOIN MONOHYD MACRO 100 MG PO CAPS: 100.0000 mg | ORAL_CAPSULE | Freq: Two times a day (BID) | ORAL | Status: DC

## 2016-02-28 NOTE — Telephone Encounter (Signed)
Even though patient has reported side effect with Macrobid (Nitrofurantoin), she wants refill for her trip to the [place]. This will be done to [facility]. Also she is requesting refill on Crestor to [place] although it looks like she has refills through [facility]. We have done this as well today.

## 2016-04-01 ENCOUNTER — Other Ambulatory Visit: Payer: Self-pay | Admitting: Internal Medicine

## 2016-05-04 ENCOUNTER — Other Ambulatory Visit: Payer: Self-pay

## 2016-05-04 MED ORDER — BUTALBITAL-APAP-CAFFEINE 50-325-40 MG PO CAPS: ORAL_CAPSULE | ORAL | Status: DC

## 2016-06-04 ENCOUNTER — Other Ambulatory Visit: Payer: Self-pay | Admitting: Internal Medicine

## 2016-06-18 ENCOUNTER — Encounter: Payer: Self-pay | Admitting: Internal Medicine

## 2016-06-18 ENCOUNTER — Ambulatory Visit (INDEPENDENT_AMBULATORY_CARE_PROVIDER_SITE_OTHER): Payer: Medicare Other | Admitting: Internal Medicine

## 2016-06-18 VITALS — BP 146/76 | HR 79 | Temp 99.2°F | Ht 62.5 in | Wt 133.0 lb

## 2016-06-18 DIAGNOSIS — R3915 Urgency of urination: Secondary | ICD-10-CM | POA: Diagnosis not present

## 2016-06-18 DIAGNOSIS — R319 Hematuria, unspecified: Secondary | ICD-10-CM

## 2016-06-18 DIAGNOSIS — Z0000 Encounter for general adult medical examination without abnormal findings: Secondary | ICD-10-CM

## 2016-06-18 DIAGNOSIS — I2510 Atherosclerotic heart disease of native coronary artery without angina pectoris: Secondary | ICD-10-CM | POA: Diagnosis not present

## 2016-06-18 LAB — POCT URINALYSIS DIPSTICK
Bilirubin, UA: NEGATIVE
Glucose, UA: NEGATIVE
Ketones, UA: NEGATIVE
Nitrite, UA: NEGATIVE
Protein, UA: NEGATIVE
Spec Grav, UA: 1.025
Urobilinogen, UA: 0.2
pH, UA: 5

## 2016-06-18 MED ORDER — CIPROFLOXACIN HCL 500 MG PO TABS: 500.0000 mg | ORAL_TABLET | Freq: Two times a day (BID) | ORAL | 0 refills | Status: DC

## 2016-06-18 MED ORDER — FLUCONAZOLE 150 MG PO TABS: ORAL_TABLET | ORAL | 5 refills | Status: DC

## 2016-06-20 LAB — URINE CULTURE

## 2016-06-21 NOTE — Telephone Encounter (Signed)
This encounter was created in error - please disregard.

## 2016-06-21 NOTE — Telephone Encounter (Signed)
-----   Message from Elby Showers, MD sent at 06/20/2016  4:41 PM EDT ----- Have spoken with patient about culture results and to continue Cipro which has excellent sensitivity. Organism is E.coli.

## 2016-06-30 NOTE — Progress Notes (Signed)
   Subjective:    Patient ID: [name], female    DOB: 11-Nov-1950, 65 y.o.   MRN: [id number]  HPI Martin Majestic to a cat show out of state this past weekend and drove for long period of time. History of recurrent urinary infections. Has not had one in  about a year now.  Has lower abdominal pressure and dysuria.  Urinalysis is abnormal. Culture taken.    Review of Systems no fever or shaking chills at present     Objective:   Physical Exam  No CVA tenderness. Urinalysis abnormal. Culture sent.      Assessment & Plan:  Acute UTI  Plan: She is beginning to develop some pain below her rib cage area. Likely bladder infection is moving upward into kidneys. Cipro 500 mg twice daily for 10 days. Culture is pending.

## 2016-06-30 NOTE — Patient Instructions (Signed)
Take Cipro 500 mg twice daily for 10 days. Call with progress report. Needs follow-up urine specimen in 2 weeks.

## 2016-07-23 ENCOUNTER — Ambulatory Visit (INDEPENDENT_AMBULATORY_CARE_PROVIDER_SITE_OTHER): Payer: Medicare Other | Admitting: Internal Medicine

## 2016-07-23 ENCOUNTER — Encounter: Payer: Self-pay | Admitting: Internal Medicine

## 2016-07-23 VITALS — BP 138/92 | Temp 97.7°F | Wt 131.5 lb

## 2016-07-23 DIAGNOSIS — I2510 Atherosclerotic heart disease of native coronary artery without angina pectoris: Secondary | ICD-10-CM

## 2016-07-23 DIAGNOSIS — N390 Urinary tract infection, site not specified: Secondary | ICD-10-CM | POA: Diagnosis not present

## 2016-07-23 DIAGNOSIS — R300 Dysuria: Secondary | ICD-10-CM | POA: Diagnosis not present

## 2016-07-23 LAB — POCT URINALYSIS DIPSTICK
Bilirubin, UA: NEGATIVE
Glucose, UA: NEGATIVE
Ketones, UA: NEGATIVE
Nitrite, UA: POSITIVE
Protein, UA: NEGATIVE
Spec Grav, UA: 1.01
Urobilinogen, UA: NEGATIVE
pH, UA: 5

## 2016-07-25 ENCOUNTER — Encounter: Payer: Self-pay | Admitting: Internal Medicine

## 2016-07-25 LAB — URINE CULTURE: Colony Count: >=100000

## 2016-07-25 MED ORDER — NITROFURANTOIN MONOHYD MACRO 100 MG PO CAPS: 100.0000 mg | ORAL_CAPSULE | Freq: Two times a day (BID) | ORAL | 1 refills | Status: DC

## 2016-07-25 NOTE — Progress Notes (Signed)
   Subjective:    Patient ID: [name], female    DOB: Feb 07, 1951, 65 y.o.   MRN: [id number]  HPI Patient went to cat show in [place] over the Labor Day holiday. She and her husband drove to [place]. Since then his developed UTI symptoms. No fever or shaking chills. No back pain. Does have history of recurrent urinary tract infections and has history of ESBL resistant UTI several years ago.  Urine dipstick is abnormal. Culture was sent.    Review of Systems no nausea vomiting fever or shaking chills     Objective:   Physical Exam No CVA tenderness. See POCT urine dipstick results       Assessment & Plan:  Acute UTI  Plan: Await urine culture results before prescribing medication  Addendum 07/25/2016. Urine culture shows greater 100,000 colonies per milliliter Escherichia coli. She has multiple drug resistance. It is sensitive to nitrofurantoin. Call and Macrobid 100 mg #20 with one refill one by mouth twice daily. Advise rechecking urine specimen in 10 days to 2 weeks.

## 2016-07-25 NOTE — Patient Instructions (Signed)
Macrobid 100 mg twice daily for 10 days with plans to recheck urine specimen in 10 days to 2 weeks.

## 2016-08-06 ENCOUNTER — Encounter: Payer: Self-pay | Admitting: Internal Medicine

## 2016-08-06 ENCOUNTER — Ambulatory Visit (INDEPENDENT_AMBULATORY_CARE_PROVIDER_SITE_OTHER): Payer: Medicare Other | Admitting: Internal Medicine

## 2016-08-06 VITALS — BP 142/88 | HR 92 | Temp 98.4°F | Wt 131.0 lb

## 2016-08-06 DIAGNOSIS — I2510 Atherosclerotic heart disease of native coronary artery without angina pectoris: Secondary | ICD-10-CM | POA: Diagnosis not present

## 2016-08-06 DIAGNOSIS — Z87898 Personal history of other specified conditions: Secondary | ICD-10-CM

## 2016-08-06 DIAGNOSIS — Z87440 Personal history of urinary (tract) infections: Secondary | ICD-10-CM

## 2016-08-06 DIAGNOSIS — Z23 Encounter for immunization: Secondary | ICD-10-CM | POA: Diagnosis not present

## 2016-08-06 LAB — POCT URINALYSIS DIPSTICK
Bilirubin, UA: NEGATIVE
Blood, UA: NEGATIVE
Glucose, UA: NEGATIVE
Ketones, UA: NEGATIVE
Leukocytes, UA: NEGATIVE
Nitrite, UA: NEGATIVE
Protein, UA: NEGATIVE
Spec Grav, UA: 1.01
Urobilinogen, UA: NEGATIVE
pH, UA: 5

## 2016-08-17 ENCOUNTER — Other Ambulatory Visit: Payer: Self-pay | Admitting: Internal Medicine

## 2016-08-27 NOTE — Progress Notes (Signed)
   Subjective:    Patient ID: [name], female    DOB: [birth year], 65 y.o.   MRN: [id number]  HPI Patient seen September 18 with acute UTI. She is here today for follow-up and to receive flu vaccine. Urine culture grew greater than 100,000 colonies per milliliter E. Coli. Organism was resistant to Cipro, levofloxacin, trimethoprim sulfa, Augmentin and ampicillin and cefazolin. She was treated with Macrobid. She is feeling better. Urinalysis today is normal.    Review of Systems see above     Objective:   Physical Exam  No CVA tenderness. Urine dipstick normal.      Assessment & Plan:  Recurrent urinary tract infection status post acute UTI  Plan: Urine infection has resolved. Flu vaccine given.

## 2016-08-27 NOTE — Patient Instructions (Addendum)
It was pleasure to see you today. Urine infection has resolved. Flu vaccine given.

## 2016-09-01 ENCOUNTER — Telehealth: Payer: Self-pay | Admitting: Internal Medicine

## 2016-09-01 NOTE — Telephone Encounter (Signed)
Painful area along gingiva. Pt thinks could be thrush. Peroxide made symptoms worse. Took Diflucan on Thursday. May take another one now. Has tried [employer] on another occasion but did not help then. Diflucan looks like best alternative on Up to Date if indeed thrush.

## 2016-09-06 ENCOUNTER — Other Ambulatory Visit: Payer: Self-pay | Admitting: Internal Medicine

## 2016-09-13 ENCOUNTER — Ambulatory Visit (INDEPENDENT_AMBULATORY_CARE_PROVIDER_SITE_OTHER): Payer: Medicare Other | Admitting: Internal Medicine

## 2016-09-13 ENCOUNTER — Encounter: Payer: Self-pay | Admitting: Internal Medicine

## 2016-09-13 VITALS — BP 142/80 | HR 86 | Temp 97.7°F | Ht 62.5 in | Wt 126.0 lb

## 2016-09-13 DIAGNOSIS — Z79890 Hormone replacement therapy (postmenopausal): Secondary | ICD-10-CM

## 2016-09-13 DIAGNOSIS — J029 Acute pharyngitis, unspecified: Secondary | ICD-10-CM | POA: Diagnosis not present

## 2016-09-13 DIAGNOSIS — K121 Other forms of stomatitis: Secondary | ICD-10-CM

## 2016-09-13 DIAGNOSIS — K130 Diseases of lips: Secondary | ICD-10-CM

## 2016-09-13 DIAGNOSIS — Z5181 Encounter for therapeutic drug level monitoring: Secondary | ICD-10-CM

## 2016-09-13 DIAGNOSIS — I2510 Atherosclerotic heart disease of native coronary artery without angina pectoris: Secondary | ICD-10-CM | POA: Diagnosis not present

## 2016-09-13 LAB — POCT RAPID STREP A (OFFICE): Rapid Strep A Screen: NEGATIVE

## 2016-09-13 MED ORDER — ESTRADIOL 10 MCG VA TABS: 10.0000 ug | ORAL_TABLET | VAGINAL | 5 refills | Status: DC

## 2016-09-13 MED ORDER — FLUCONAZOLE 150 MG PO TABS: 150.0000 mg | ORAL_TABLET | Freq: Once | ORAL | 0 refills | Status: DC

## 2016-09-13 MED ORDER — AZITHROMYCIN 250 MG PO TABS: ORAL_TABLET | ORAL | 0 refills | Status: DC

## 2016-09-13 MED ORDER — CLOTRIMAZOLE-BETAMETHASONE 1-0.05 % EX CREA: 1.0000 "application " | TOPICAL_CREAM | Freq: Two times a day (BID) | CUTANEOUS | 0 refills | Status: DC

## 2016-09-13 MED ORDER — FLUCONAZOLE 150 MG PO TABS
150.0000 mg | ORAL_TABLET | Freq: Once | ORAL | 0 refills | Status: AC
Start: 2016-09-13 — End: 2016-09-13

## 2016-09-13 NOTE — Progress Notes (Signed)
   Subjective:    Patient ID: [name], female    DOB: Sep 06, 1951, 65 y.o.   MRN: [id number]  HPI Despite trying Diflucan recently for  every other day for 3 days, patient continues to have issues with mouth lesions. She is worried about possible thrush. These lesions are very uncomfortable.  Also has developed lesions corner of mouth consistent with cheilitis.  She came down with sore throat symptoms recently.    Review of Systems see above     Objective:   Physical Exam  She has apthous type lesions buccal mucosa. Pharynx is red. Rapid strep screen is negative. Neck is supple without adenopathy. Chest clear. Lesions consistent with cheilitis corners of mouth.      Assessment & Plan:  Apthous ulcers  Cheilitis-Lotrisone cream and corners of mouth twice daily until healed. Explained that this is a yeast infection.  Non-strep pharyngitis-Zithromax Z-PAK take 2 tablets day one followed by 1 tablet days 2 through 5  Consider thrush  Plan: Trial of Diflucan 150 mg daily for 10 days. Zithromax Z-PAK as directed. Rest and drink plenty of fluids. Also new prescription for Vagifem exam vaginal tablets 3 months supply with when necessary 1 year refill

## 2016-09-30 NOTE — Patient Instructions (Signed)
Lotrisone cream to corners of mouth until lesions are healed. Diflucan 150 mg daily for 10 days. Zithromax Z-PAK take as directed for pharyngitis. Rest and drink plenty of fluids. Vagifem refill by written prescription.

## 2016-10-17 ENCOUNTER — Other Ambulatory Visit: Payer: Self-pay | Admitting: Internal Medicine

## 2016-10-17 NOTE — Telephone Encounter (Signed)
Please refill all x one year

## 2016-10-18 ENCOUNTER — Other Ambulatory Visit: Payer: Medicare Other | Admitting: Internal Medicine

## 2016-10-18 DIAGNOSIS — E559 Vitamin D deficiency, unspecified: Secondary | ICD-10-CM

## 2016-10-18 DIAGNOSIS — E119 Type 2 diabetes mellitus without complications: Secondary | ICD-10-CM

## 2016-10-18 DIAGNOSIS — E785 Hyperlipidemia, unspecified: Secondary | ICD-10-CM | POA: Diagnosis not present

## 2016-10-18 DIAGNOSIS — I10 Essential (primary) hypertension: Secondary | ICD-10-CM

## 2016-10-18 DIAGNOSIS — Z0000 Encounter for general adult medical examination without abnormal findings: Secondary | ICD-10-CM | POA: Diagnosis not present

## 2016-10-18 LAB — COMPLETE METABOLIC PANEL WITH GFR
ALT: 28 U/L (ref 6–29)
AST: 29 U/L (ref 10–35)
Albumin: 4.1 g/dL (ref 3.6–5.1)
Alkaline Phosphatase: 63 U/L (ref 33–130)
BUN: 18 mg/dL (ref 7–25)
CO2: 28 mmol/L (ref 20–31)
Calcium: 9.2 mg/dL (ref 8.6–10.4)
Chloride: 102 mmol/L (ref 98–110)
Creat: 0.71 mg/dL (ref 0.50–0.99)
GFR, Est African American: >89 mL/min (ref >=60)
GFR, Est Non African American: 89 mL/min (ref >=60)
Glucose, Bld: 72 mg/dL (ref 65–99)
Potassium: 4.1 mmol/L (ref 3.5–5.3)
Sodium: 139 mmol/L (ref 135–146)
Total Bilirubin: 0.5 mg/dL (ref 0.2–1.2)
Total Protein: 6.8 g/dL (ref 6.1–8.1)

## 2016-10-18 LAB — HEMOGLOBIN A1C
Hgb A1c MFr Bld: 5.4 % (ref <5.7)
Mean Plasma Glucose: 108 mg/dL

## 2016-10-18 LAB — CBC WITH DIFFERENTIAL/PLATELET
Basophils Absolute: 62 cells/uL (ref 0–200)
Basophils Relative: 1 %
Eosinophils Absolute: 186 cells/uL (ref 15–500)
Eosinophils Relative: 3 %
HCT: 42.5 % (ref 35.0–45.0)
Hemoglobin: 14.3 g/dL (ref 11.7–15.5)
Lymphocytes Relative: 25 %
Lymphs Abs: 1550 cells/uL (ref 850–3900)
MCH: 30.8 pg (ref 27.0–33.0)
MCHC: 33.6 g/dL (ref 32.0–36.0)
MCV: 91.6 fL (ref 80.0–100.0)
MPV: 8.9 fL (ref 7.5–12.5)
Monocytes Absolute: 558 cells/uL (ref 200–950)
Monocytes Relative: 9 %
Neutro Abs: 3844 cells/uL (ref 1500–7800)
Neutrophils Relative %: 62 %
Platelets: 223 10*3/uL (ref 140–400)
RBC: 4.64 MIL/uL (ref 3.80–5.10)
RDW: 14.7 % (ref 11.0–15.0)
WBC: 6.2 10*3/uL (ref 3.8–10.8)

## 2016-10-18 LAB — TSH: TSH: 2.44 mIU/L

## 2016-10-18 LAB — LIPID PANEL
Cholesterol: 222 mg/dL — ABNORMAL HIGH (ref <200)
HDL: 77 mg/dL (ref >50)
LDL Cholesterol: 120 mg/dL — ABNORMAL HIGH (ref <100)
Total CHOL/HDL Ratio: 2.9 Ratio (ref <5.0)
Triglycerides: 126 mg/dL (ref <150)
VLDL: 25 mg/dL (ref <30)

## 2016-10-19 ENCOUNTER — Encounter: Payer: Self-pay | Admitting: Internal Medicine

## 2016-10-19 ENCOUNTER — Other Ambulatory Visit: Payer: Self-pay | Admitting: Internal Medicine

## 2016-10-19 ENCOUNTER — Ambulatory Visit (INDEPENDENT_AMBULATORY_CARE_PROVIDER_SITE_OTHER): Payer: Medicare Other | Admitting: Internal Medicine

## 2016-10-19 VITALS — BP 148/78 | HR 91 | Temp 98.0°F | Ht 63.0 in | Wt 127.0 lb

## 2016-10-19 DIAGNOSIS — E7800 Pure hypercholesterolemia, unspecified: Secondary | ICD-10-CM

## 2016-10-19 DIAGNOSIS — Z9981 Dependence on supplemental oxygen: Secondary | ICD-10-CM | POA: Diagnosis not present

## 2016-10-19 DIAGNOSIS — I10 Essential (primary) hypertension: Secondary | ICD-10-CM

## 2016-10-19 DIAGNOSIS — E2839 Other primary ovarian failure: Secondary | ICD-10-CM

## 2016-10-19 DIAGNOSIS — Z0000 Encounter for general adult medical examination without abnormal findings: Secondary | ICD-10-CM

## 2016-10-19 DIAGNOSIS — Z8669 Personal history of other diseases of the nervous system and sense organs: Secondary | ICD-10-CM | POA: Diagnosis not present

## 2016-10-19 DIAGNOSIS — I2510 Atherosclerotic heart disease of native coronary artery without angina pectoris: Secondary | ICD-10-CM

## 2016-10-19 DIAGNOSIS — Z8709 Personal history of other diseases of the respiratory system: Secondary | ICD-10-CM | POA: Diagnosis not present

## 2016-10-19 DIAGNOSIS — I519 Heart disease, unspecified: Secondary | ICD-10-CM

## 2016-10-19 DIAGNOSIS — N390 Urinary tract infection, site not specified: Secondary | ICD-10-CM

## 2016-10-19 DIAGNOSIS — G4709 Other insomnia: Secondary | ICD-10-CM | POA: Diagnosis not present

## 2016-10-19 LAB — POCT URINALYSIS DIPSTICK
Bilirubin, UA: NEGATIVE
Blood, UA: NEGATIVE
Glucose, UA: NEGATIVE
Ketones, UA: NEGATIVE
Leukocytes, UA: NEGATIVE
Nitrite, UA: NEGATIVE
Protein, UA: NEGATIVE
Spec Grav, UA: 1.015
Urobilinogen, UA: NEGATIVE
pH, UA: 5

## 2016-10-19 LAB — VITAMIN D 25 HYDROXY (VIT D DEFICIENCY, FRACTURES): Vit D, 25-Hydroxy: 70 ng/mL (ref 30–100)

## 2016-10-19 LAB — MICROALBUMIN, URINE: Microalb, Ur: 1.9 mg/dL

## 2016-10-19 MED ORDER — MOMETASONE FURO-FORMOTEROL FUM 100-5 MCG/ACT IN AERO: INHALATION_SPRAY | RESPIRATORY_TRACT | 11 refills | Status: DC
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
## 2016-11-03 ENCOUNTER — Encounter: Payer: Self-pay | Admitting: Internal Medicine

## 2016-11-03 NOTE — Patient Instructions (Signed)
It was a pleasure to see you today. Monitor blood pressure at home. Consider mammogram and if will not consider colonoscopy, call and see if insurance will cover Cologuard screening. Immunizations are up-to-date.

## 2017-01-01 ENCOUNTER — Telehealth: Payer: Self-pay | Admitting: Internal Medicine

## 2017-01-01 MED ORDER — BUTALBITAL-APAP-CAFFEINE 50-325-40 MG PO CAPS: ORAL_CAPSULE | ORAL | 3 refills | Status: DC

## 2017-01-01 NOTE — Telephone Encounter (Signed)
Done

## 2017-01-01 NOTE — Telephone Encounter (Signed)
Patient is calling for a refill on her Butalbital-APAP-Caffeine 50-325-40mg .   States that [facility] says they sent it to [place] on Friday.  I advised that I didn't see anything on this, but I would send the request to you.    Pharmacy:  [facility]

## 2017-01-01 NOTE — Telephone Encounter (Signed)
Call in #60 with 3 refills. This medication cannot be e- prescribed

## 2017-01-04 ENCOUNTER — Other Ambulatory Visit: Payer: Self-pay | Admitting: Internal Medicine

## 2017-02-06 ENCOUNTER — Other Ambulatory Visit: Payer: Self-pay | Admitting: Internal Medicine

## 2017-02-14 ENCOUNTER — Other Ambulatory Visit: Payer: Self-pay | Admitting: Internal Medicine

## 2017-03-01 ENCOUNTER — Other Ambulatory Visit: Payer: Self-pay | Admitting: Internal Medicine

## 2017-04-01 ENCOUNTER — Other Ambulatory Visit: Payer: Self-pay | Admitting: Internal Medicine

## 2017-04-16 ENCOUNTER — Other Ambulatory Visit: Payer: Medicare Other | Admitting: Internal Medicine

## 2017-04-16 DIAGNOSIS — E785 Hyperlipidemia, unspecified: Secondary | ICD-10-CM

## 2017-04-16 DIAGNOSIS — R7302 Impaired glucose tolerance (oral): Secondary | ICD-10-CM | POA: Diagnosis not present

## 2017-04-16 LAB — LIPID PANEL
Cholesterol: 212 mg/dL — ABNORMAL HIGH (ref <200)
HDL: 82 mg/dL (ref >50)
LDL Cholesterol: 114 mg/dL — ABNORMAL HIGH (ref <100)
Total CHOL/HDL Ratio: 2.6 ratio (ref <5.0)
Triglycerides: 82 mg/dL (ref <150)
VLDL: 16 mg/dL (ref <30)

## 2017-04-16 LAB — HEPATIC FUNCTION PANEL
ALT: 16 U/L (ref 6–29)
AST: 19 U/L (ref 10–35)
Albumin: 3.8 g/dL (ref 3.6–5.1)
Alkaline Phosphatase: 70 U/L (ref 33–130)
Bilirubin, Direct: 0.1 mg/dL (ref <=0.2)
Indirect Bilirubin: 0.3 mg/dL (ref 0.2–1.2)
Total Bilirubin: 0.4 mg/dL (ref 0.2–1.2)
Total Protein: 6.8 g/dL (ref 6.1–8.1)

## 2017-04-17 LAB — HEMOGLOBIN A1C
Hgb A1c MFr Bld: 5.5 % (ref <5.7)
Mean Plasma Glucose: 111 mg/dL

## 2017-04-18 ENCOUNTER — Ambulatory Visit (INDEPENDENT_AMBULATORY_CARE_PROVIDER_SITE_OTHER): Payer: Medicare Other | Admitting: Internal Medicine

## 2017-04-18 ENCOUNTER — Encounter: Payer: Self-pay | Admitting: Internal Medicine

## 2017-04-18 VITALS — BP 124/88 | HR 72 | Temp 98.3°F | Wt 126.0 lb

## 2017-04-18 DIAGNOSIS — Z9889 Other specified postprocedural states: Secondary | ICD-10-CM | POA: Diagnosis not present

## 2017-04-18 DIAGNOSIS — Z8669 Personal history of other diseases of the nervous system and sense organs: Secondary | ICD-10-CM

## 2017-04-18 DIAGNOSIS — N390 Urinary tract infection, site not specified: Secondary | ICD-10-CM

## 2017-04-18 DIAGNOSIS — Z9981 Dependence on supplemental oxygen: Secondary | ICD-10-CM | POA: Diagnosis not present

## 2017-04-18 DIAGNOSIS — I10 Essential (primary) hypertension: Secondary | ICD-10-CM

## 2017-04-18 DIAGNOSIS — Z23 Encounter for immunization: Secondary | ICD-10-CM | POA: Diagnosis not present

## 2017-04-18 DIAGNOSIS — Z1231 Encounter for screening mammogram for malignant neoplasm of breast: Secondary | ICD-10-CM | POA: Diagnosis not present

## 2017-04-18 DIAGNOSIS — J449 Chronic obstructive pulmonary disease, unspecified: Secondary | ICD-10-CM

## 2017-04-18 DIAGNOSIS — E7800 Pure hypercholesterolemia, unspecified: Secondary | ICD-10-CM | POA: Diagnosis not present

## 2017-04-18 DIAGNOSIS — Z1239 Encounter for other screening for malignant neoplasm of breast: Secondary | ICD-10-CM

## 2017-04-18 DIAGNOSIS — R7302 Impaired glucose tolerance (oral): Secondary | ICD-10-CM | POA: Diagnosis not present

## 2017-04-18 DIAGNOSIS — E2839 Other primary ovarian failure: Secondary | ICD-10-CM | POA: Diagnosis not present

## 2017-04-18 DIAGNOSIS — Z905 Acquired absence of kidney: Secondary | ICD-10-CM

## 2017-04-18 MED ORDER — ESTRADIOL 10 MCG VA TABS: 10.0000 ug | ORAL_TABLET | VAGINAL | 3 refills | Status: DC

## 2017-04-18 MED ORDER — ECONAZOLE NITRATE 1 % EX CREA: TOPICAL_CREAM | CUTANEOUS | 2 refills | Status: DC

## 2017-04-18 NOTE — Progress Notes (Signed)
   Subjective:    Patient ID: [name], female    DOB: 15-Apr-1951, 66 y.o.   MRN: [id number]  HPI 66 year old Female in today for follow-up on hypertension, hyperlipidemia, COPD. [place]. Noticed some lesions on her arms after that that appear to be insect bites.  Has developed foot rash is well.  Lipid panel shows total cholesterol of 212 and previously 6 months ago was 222. LDL cholesterol is 114 in 6 months ago was 120. Triglycerides are normal. HDL cholesterol is 82 and 6 months ago was 77.  Liver functions are normal. She is on statin medication.  Hemoglobin A1c is stable at 5.5% previously was 5.4%.  Hemoglobin A1c in 2015 was 5.7% in 2016 5.7%.  In 2014 hemoglobin A1c was 5.9%.  She is on Crestor 20 mg daily and takes coenzyme Q for musculoskeletal pain associated with statin therapy.  She takes Valtrex daily for herpes simplex prophylaxis.  History of GE reflux treated with Protonix.  Takes HCTZ and Cozaar.  Takes MiraLAX for chronic constipation.  She is on chronic oxygen therapy for history of COPD. 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    Review of Systems see above     Objective:   Physical Exam Skin warm and dry. Nodes none. Neck is supple. No thyromegaly. Chest clear. Cardiac exam regular rate and rhythm normal S1 and S2. Extremities without edema. She has papular rash affecting her foot that is consistent with Tinea pedis.       Assessment & Plan:  History of impaired glucose tolerance-now has normal hemoglobin A1c  COPD-oxygen dependent and stable  Hyperlipidemia-stable on Crestor 20 mg daily  Insect bites to arms appears to be resolving  Tinea pedis-treat with econazole nitrate 1% cream  Pneumovax  23 vaccine given today. Advise patient to have mammogram. She took information on [facility]. Does not want colonoscopy.

## 2017-04-29 ENCOUNTER — Encounter: Payer: Self-pay | Admitting: Internal Medicine

## 2017-04-29 ENCOUNTER — Other Ambulatory Visit: Payer: Self-pay | Admitting: Internal Medicine

## 2017-04-29 DIAGNOSIS — Z905 Acquired absence of kidney: Secondary | ICD-10-CM | POA: Insufficient documentation

## 2017-04-29 DIAGNOSIS — Z8669 Personal history of other diseases of the nervous system and sense organs: Secondary | ICD-10-CM | POA: Insufficient documentation

## 2017-04-29 DIAGNOSIS — Z9889 Other specified postprocedural states: Secondary | ICD-10-CM | POA: Insufficient documentation

## 2017-04-29 DIAGNOSIS — R7302 Impaired glucose tolerance (oral): Secondary | ICD-10-CM | POA: Insufficient documentation

## 2017-04-29 NOTE — Patient Instructions (Signed)
It was a pleasure to see you today. Consider mammogram. Consider Cologuard, Pneumovax 23 vaccine given today. Try econazole nitrate for foot rash

## 2017-05-22 ENCOUNTER — Ambulatory Visit: Payer: Medicare Other

## 2017-05-23 ENCOUNTER — Ambulatory Visit
Admission: RE | Admit: 2017-05-23 | Discharge: 2017-05-23 | Disposition: A | Discharge status: Home / Self Care | Payer: Medicare Other | Source: Ambulatory Visit | Attending: Internal Medicine | Admitting: Internal Medicine

## 2017-05-23 DIAGNOSIS — E2839 Other primary ovarian failure: Secondary | ICD-10-CM

## 2017-05-23 DIAGNOSIS — Z1239 Encounter for other screening for malignant neoplasm of breast: Secondary | ICD-10-CM

## 2017-05-23 DIAGNOSIS — M8589 Other specified disorders of bone density and structure, multiple sites: Secondary | ICD-10-CM | POA: Diagnosis not present

## 2017-05-23 DIAGNOSIS — Z1231 Encounter for screening mammogram for malignant neoplasm of breast: Secondary | ICD-10-CM | POA: Diagnosis not present

## 2017-05-23 DIAGNOSIS — Z780 Asymptomatic menopausal state: Secondary | ICD-10-CM | POA: Diagnosis not present

## 2017-05-24 ENCOUNTER — Other Ambulatory Visit: Payer: Self-pay

## 2017-07-01 DIAGNOSIS — Z1211 Encounter for screening for malignant neoplasm of colon: Secondary | ICD-10-CM | POA: Diagnosis not present

## 2017-07-01 DIAGNOSIS — Z1212 Encounter for screening for malignant neoplasm of rectum: Secondary | ICD-10-CM | POA: Diagnosis not present

## 2017-07-15 ENCOUNTER — Encounter: Payer: Self-pay | Admitting: Gastroenterology

## 2017-07-17 ENCOUNTER — Other Ambulatory Visit: Payer: Self-pay | Admitting: Internal Medicine

## 2017-07-17 NOTE — Telephone Encounter (Addendum)
Called in #60 with one refill-MJB

## 2017-07-18 DIAGNOSIS — Z23 Encounter for immunization: Secondary | ICD-10-CM | POA: Diagnosis not present

## 2017-07-23 ENCOUNTER — Other Ambulatory Visit: Payer: Self-pay | Admitting: Internal Medicine

## 2017-07-29 ENCOUNTER — Other Ambulatory Visit: Payer: Self-pay

## 2017-07-29 ENCOUNTER — Telehealth: Payer: Self-pay | Admitting: *Deleted

## 2017-07-29 DIAGNOSIS — R195 Other fecal abnormalities: Secondary | ICD-10-CM

## 2017-07-29 NOTE — Telephone Encounter (Signed)
Left a message for the patient to call. Her colonoscopy has been moved to [facility] 09/20/17 at 8:30 am start time.

## 2017-07-29 NOTE — Telephone Encounter (Signed)
Patient is for direct colonoscopy for positive cologuard. She has COPD and uses home oxygen for this, should she have OV before colon is done or ok for direct hospital colonoscopy? Please advise. Thank you, Robbin pv

## 2017-07-29 NOTE — Telephone Encounter (Signed)
Noted. Routed information to [facility]. [facility] colon cancelled.

## 2017-07-29 NOTE — Telephone Encounter (Signed)
Moved to next open hospital day 09/20/17 at 8:30 am.

## 2017-07-29 NOTE — Telephone Encounter (Signed)
Ok for direct colonoscopy at hospital

## 2017-08-01 ENCOUNTER — Other Ambulatory Visit: Payer: Self-pay

## 2017-08-01 NOTE — Telephone Encounter (Signed)
No answer

## 2017-08-01 NOTE — Telephone Encounter (Signed)
No, I have not called her.

## 2017-08-01 NOTE — Telephone Encounter (Signed)
Letter to the patient. 

## 2017-08-01 NOTE — Telephone Encounter (Signed)
Did you speak with this patient? I am concerned she does not know she was going to be moved from [facility] to the hospital. She has not returned my call. Thanks

## 2017-08-09 ENCOUNTER — Ambulatory Visit (AMBULATORY_SURGERY_CENTER): Payer: Self-pay | Admitting: *Deleted

## 2017-08-09 VITALS — Ht 63.0 in | Wt 128.8 lb

## 2017-08-09 DIAGNOSIS — R195 Other fecal abnormalities: Secondary | ICD-10-CM

## 2017-08-09 MED ORDER — NA SULFATE-K SULFATE-MG SULF 17.5-3.13-1.6 GM/177ML PO SOLN: ORAL | 0 refills | Status: DC

## 2017-08-09 NOTE — Progress Notes (Signed)
No allergies to eggs or soy. No problems with anesthesia.  Pt given Emmi instructions for colonoscopy  Pt on oxygen- 3 liters most hours of the day  No diet drug use

## 2017-08-22 ENCOUNTER — Ambulatory Visit (INDEPENDENT_AMBULATORY_CARE_PROVIDER_SITE_OTHER): Payer: Medicare Other | Admitting: Internal Medicine

## 2017-08-22 ENCOUNTER — Encounter: Payer: Self-pay | Admitting: Internal Medicine

## 2017-08-22 VITALS — BP 124/80 | HR 99 | Temp 99.2°F

## 2017-08-22 DIAGNOSIS — Z87440 Personal history of urinary (tract) infections: Secondary | ICD-10-CM | POA: Diagnosis not present

## 2017-08-22 DIAGNOSIS — R509 Fever, unspecified: Secondary | ICD-10-CM | POA: Diagnosis not present

## 2017-08-22 DIAGNOSIS — M545 Low back pain: Secondary | ICD-10-CM

## 2017-08-22 LAB — POCT URINALYSIS DIPSTICK
Bilirubin, UA: NEGATIVE
Blood, UA: NEGATIVE
Glucose, UA: NEGATIVE
Ketones, UA: NEGATIVE
Leukocytes, UA: NEGATIVE
Nitrite, UA: NEGATIVE
Protein, UA: NEGATIVE
Spec Grav, UA: <=1.005 — AB (ref 1.010–1.025)
Urobilinogen, UA: 0.2 E.U./dL
pH, UA: 7.5 (ref 5.0–8.0)

## 2017-08-22 NOTE — Patient Instructions (Addendum)
Urine culture sent. Await results. Patient prefers to wait result before treatment.

## 2017-08-22 NOTE — Progress Notes (Signed)
Came in for a possible UTI, having lower back pain and fever.  Urine dipstick normal. Culture sent. Await results. Patient prefers to wait before treating.

## 2017-08-23 ENCOUNTER — Encounter: Payer: Medicare Other | Admitting: Gastroenterology

## 2017-08-24 LAB — URINE CULTURE
MICRO NUMBER:: 81165192
SPECIMEN QUALITY:: ADEQUATE

## 2017-09-16 ENCOUNTER — Other Ambulatory Visit: Payer: Self-pay

## 2017-09-16 ENCOUNTER — Encounter (HOSPITAL_COMMUNITY): Payer: Self-pay | Admitting: Emergency Medicine

## 2017-09-20 ENCOUNTER — Encounter (HOSPITAL_COMMUNITY): Payer: Self-pay | Admitting: Gastroenterology

## 2017-09-20 ENCOUNTER — Ambulatory Visit (HOSPITAL_COMMUNITY): Payer: Medicare Other | Admitting: Anesthesiology

## 2017-09-20 ENCOUNTER — Encounter (HOSPITAL_COMMUNITY)
Admission: RE | Disposition: A | Discharge status: Home / Self Care | Payer: Self-pay | Source: Ambulatory Visit | Attending: Gastroenterology

## 2017-09-20 ENCOUNTER — Ambulatory Visit (HOSPITAL_COMMUNITY)
Admission: RE | Admit: 2017-09-20 | Discharge: 2017-09-20 | Disposition: A | Discharge status: Home / Self Care | Payer: Medicare Other | Source: Ambulatory Visit | Attending: Gastroenterology | Admitting: Gastroenterology

## 2017-09-20 DIAGNOSIS — K5730 Diverticulosis of large intestine without perforation or abscess without bleeding: Secondary | ICD-10-CM

## 2017-09-20 DIAGNOSIS — Q439 Congenital malformation of intestine, unspecified: Secondary | ICD-10-CM | POA: Insufficient documentation

## 2017-09-20 DIAGNOSIS — R195 Other fecal abnormalities: Secondary | ICD-10-CM

## 2017-09-20 DIAGNOSIS — K5790 Diverticulosis of intestine, part unspecified, without perforation or abscess without bleeding: Secondary | ICD-10-CM | POA: Diagnosis not present

## 2017-09-20 DIAGNOSIS — D123 Benign neoplasm of transverse colon: Secondary | ICD-10-CM | POA: Diagnosis not present

## 2017-09-20 DIAGNOSIS — Z79890 Hormone replacement therapy (postmenopausal): Secondary | ICD-10-CM | POA: Diagnosis not present

## 2017-09-20 DIAGNOSIS — K635 Polyp of colon: Secondary | ICD-10-CM

## 2017-09-20 DIAGNOSIS — Z79899 Other long term (current) drug therapy: Secondary | ICD-10-CM | POA: Insufficient documentation

## 2017-09-20 DIAGNOSIS — E785 Hyperlipidemia, unspecified: Secondary | ICD-10-CM | POA: Diagnosis not present

## 2017-09-20 DIAGNOSIS — I10 Essential (primary) hypertension: Secondary | ICD-10-CM | POA: Insufficient documentation

## 2017-09-20 DIAGNOSIS — Z905 Acquired absence of kidney: Secondary | ICD-10-CM | POA: Insufficient documentation

## 2017-09-20 DIAGNOSIS — K639 Disease of intestine, unspecified: Secondary | ICD-10-CM

## 2017-09-20 DIAGNOSIS — K219 Gastro-esophageal reflux disease without esophagitis: Secondary | ICD-10-CM | POA: Diagnosis not present

## 2017-09-20 DIAGNOSIS — I2510 Atherosclerotic heart disease of native coronary artery without angina pectoris: Secondary | ICD-10-CM | POA: Diagnosis not present

## 2017-09-20 DIAGNOSIS — J439 Emphysema, unspecified: Secondary | ICD-10-CM | POA: Insufficient documentation

## 2017-09-20 DIAGNOSIS — Z9981 Dependence on supplemental oxygen: Secondary | ICD-10-CM | POA: Insufficient documentation

## 2017-09-20 DIAGNOSIS — K5140 Inflammatory polyps of colon without complications: Secondary | ICD-10-CM | POA: Diagnosis not present

## 2017-09-20 DIAGNOSIS — D175 Benign lipomatous neoplasm of intra-abdominal organs: Secondary | ICD-10-CM

## 2017-09-20 DIAGNOSIS — K648 Other hemorrhoids: Secondary | ICD-10-CM | POA: Diagnosis not present

## 2017-09-20 DIAGNOSIS — D120 Benign neoplasm of cecum: Secondary | ICD-10-CM | POA: Diagnosis not present

## 2017-09-20 DIAGNOSIS — K6389 Other specified diseases of intestine: Secondary | ICD-10-CM | POA: Diagnosis not present

## 2017-09-20 DIAGNOSIS — Z85528 Personal history of other malignant neoplasm of kidney: Secondary | ICD-10-CM | POA: Insufficient documentation

## 2017-09-20 DIAGNOSIS — Z87891 Personal history of nicotine dependence: Secondary | ICD-10-CM | POA: Insufficient documentation

## 2017-09-20 HISTORY — PX: COLONOSCOPY WITH PROPOFOL: SHX5780

## 2017-09-20 SURGERY — COLONOSCOPY WITH PROPOFOL
Anesthesia: Monitor Anesthesia Care

## 2017-09-20 MED ORDER — PROPOFOL 10 MG/ML IV BOLUS
INTRAVENOUS | Status: AC
  Filled 2017-09-20: qty 20

## 2017-09-20 MED ORDER — LABETALOL HCL 5 MG/ML IV SOLN
INTRAVENOUS | Status: DC | PRN
  Administered 2017-09-20: 5 mg via INTRAVENOUS

## 2017-09-20 MED ORDER — LABETALOL HCL 5 MG/ML IV SOLN
INTRAVENOUS | Status: AC
  Filled 2017-09-20: qty 4

## 2017-09-20 MED ORDER — PROPOFOL 500 MG/50ML IV EMUL
INTRAVENOUS | Status: DC | PRN
  Administered 2017-09-20: 125 ug/kg/min via INTRAVENOUS

## 2017-09-20 MED ORDER — PROPOFOL 10 MG/ML IV BOLUS
INTRAVENOUS | Status: AC
  Filled 2017-09-20: qty 40

## 2017-09-20 MED ORDER — SODIUM CHLORIDE 0.9 % IV SOLN: INTRAVENOUS | Status: DC

## 2017-09-20 MED ORDER — LACTATED RINGERS IV SOLN
INTRAVENOUS | Status: DC
Start: 2017-09-20 — End: 2017-09-20
  Administered 2017-09-20: 08:00:00 via INTRAVENOUS

## 2017-09-20 MED ORDER — PROPOFOL 500 MG/50ML IV EMUL
INTRAVENOUS | Status: DC | PRN
  Administered 2017-09-20: 20 mg via INTRAVENOUS
  Administered 2017-09-20: 40 mg via INTRAVENOUS
  Administered 2017-09-20: 20 mg via INTRAVENOUS
  Administered 2017-09-20: 10 mg via INTRAVENOUS

## 2017-09-20 SURGICAL SUPPLY — 22 items

## 2017-09-20 NOTE — Anesthesia Procedure Notes (Signed)
Date/Time: 09/20/2017 8:38 AM Performed by: Glory Buff, CRNA Oxygen Delivery Method: Simple face mask

## 2017-09-20 NOTE — Anesthesia Preprocedure Evaluation (Addendum)
Anesthesia Evaluation  Patient identified by MRN, date of birth, ID band Patient awake    Reviewed: Allergy & Precautions, NPO status , Patient's Chart, lab work & pertinent test results, reviewed documented beta blocker date and time   Airway Mallampati: II  TM Distance: >3 FB Neck ROM: Full    Dental no notable dental hx.    Pulmonary COPD, former smoker,  Oxygen dependent, 3L [place]   Pulmonary exam normal breath sounds clear to auscultation       Cardiovascular hypertension, Pt. on home beta blockers and Pt. on medications Normal cardiovascular exam Rhythm:Regular Rate:Normal     Neuro/Psych negative neurological ROS  negative psych ROS   GI/Hepatic Neg liver ROS, GERD  Medicated and Controlled,  Endo/Other  negative endocrine ROS  Renal/GU Renal disease     Musculoskeletal negative musculoskeletal ROS (+)   Abdominal   Peds  Hematology negative hematology ROS (+)   Anesthesia Other Findings Hyperlipidemia  Reproductive/Obstetrics                            Anesthesia Physical Anesthesia Plan  ASA: IV  Anesthesia Plan: MAC   Post-op Pain Management:    Induction: Intravenous  PONV Risk Score and Plan: 2 and Propofol infusion  Airway Management Planned: Natural Airway  Additional Equipment:   Intra-op Plan:   Post-operative Plan:   Informed Consent: I have reviewed the patients History and Physical, chart, labs and discussed the procedure including the risks, benefits and alternatives for the proposed anesthesia with the patient or authorized representative who has indicated his/her understanding and acceptance.   Dental advisory given  Plan Discussed with: CRNA  Anesthesia Plan Comments:         Anesthesia Quick Evaluation

## 2017-09-20 NOTE — Op Note (Signed)
[facility] Patient Name: [name] Procedure Date: 09/20/2017 MRN: [id number] Attending MD: Mauri Pole , MD Date of Birth: [birth year] CSN: [id number] Age: 66 Admit Type: Outpatient Procedure:                Colonoscopy Indications:              Positive Cologuard test Providers:                Mauri Pole, MD, Laverta Baltimore RN, RN,                            William Dalton, Technician Referring MD:              Medicines:                Monitored Anesthesia Care Complications:            No immediate complications. Estimated Blood Loss:     Estimated blood loss was minimal. Procedure:                Pre-Anesthesia Assessment:                           - Prior to the procedure, a History and Physical                            was performed, and patient medications and                            allergies were reviewed. The patient's tolerance of                            previous anesthesia was also reviewed. The risks                            and benefits of the procedure and the sedation                            options and risks were discussed with the patient.                            All questions were answered, and informed consent                            was obtained. Prior Anticoagulants: The patient has                            taken no previous anticoagulant or antiplatelet                            agents. ASA Grade Assessment: III - A patient with                            severe systemic disease. After reviewing the risks  and benefits, the patient was deemed in                            satisfactory condition to undergo the procedure.                           After obtaining informed consent, the colonoscope                            was passed under direct vision. Throughout the                            procedure, the patient's blood pressure, pulse, and   oxygen saturations were monitored continuously. The                            EC-[id number] ([id number]) scope was introduced through                            the anus and advanced to the the terminal ileum,                            with identification of the appendiceal orifice and                            IC valve. The colonoscopy was technically difficult                            and complex due to multiple diverticula in the                            colon, significant looping and a tortuous colon.                            Successful completion of the procedure was aided by                            applying abdominal pressure. The patient tolerated                            the procedure well. The quality of the bowel                            preparation was adequate. The terminal ileum,                            ileocecal valve, appendiceal orifice, and rectum                            were photographed. Scope In: 8:50:25 AM Scope Out: 9:38:25 AM Scope Withdrawal Time: 0 hours 28 minutes 53 seconds  Total Procedure Duration: 0 hours 48 minutes 0 seconds  Findings:      The perianal and digital rectal examinations were normal.      A  20 mm polyp was found in the ileocecal valve. The polyp was ulcerated,       multi-lobulated and pedunculated. The polyp was removed with a cold       snare by error by the RN. Resection and retrieval were complete. Noted       oozing at polypectomy site, attempted placement of clip but due to       friable mucosa didnot stay on. No bleeding noted towards end of procedure      A localized area of granular friable mucosa was found at the ileocecal       valve adjacent to the polyp. Biopsies were taken with a cold forceps for       histology to exclude additional adenomatous tissue or malignancy.      A 5 mm polyp was found in the transverse colon. The polyp was sessile.       The polyp was removed with a cold snare. Resection and retrieval were        complete.      Multiple small and large-mouthed diverticula were found in the sigmoid       colon, descending colon, transverse colon and ascending colon.      Non-bleeding internal hemorrhoids were found during retroflexion. The       hemorrhoids were small. Impression:               - One 20 mm polyp at the ileocecal valve, removed                            with a cold snare. Resected and retrieved.                           - Granularity at the ileocecal valve. Biopsied.                           - One 5 mm polyp in the transverse colon, removed                            with a cold snare. Resected and retrieved.                           - Diverticulosis in the sigmoid colon, in the                            descending colon, in the transverse colon and in                            the ascending colon.                           - Non-bleeding internal hemorrhoids. Moderate Sedation:      N/A- Per Anesthesia Care Recommendation:           - Patient has a contact number available for                            emergencies. The signs and symptoms of potential  delayed complications were discussed with the                            patient. Return to normal activities tomorrow.                            Written discharge instructions were provided to the                            patient.                           - Resume previous diet.                           - Continue present medications.                           - Await pathology results.                           - Repeat colonoscopy date to be determined after                            pending pathology results are reviewed for                            surveillance based on pathology results. Procedure Code(s):        --- Professional ---                           [phone], 22, Colonoscopy, flexible; with removal of                            tumor(s), polyp(s), or other lesion(s) by  snare                            technique                           [id number], 62, Colonoscopy, flexible; with biopsy,                            single or multiple Diagnosis Code(s):        --- Professional ---                           D12.0, Benign neoplasm of cecum                           D12.3, Benign neoplasm of transverse colon (hepatic                            flexure or splenic flexure)                           K63.89, Other specified diseases of intestine  K64.8, Other hemorrhoids                           R19.5, Other fecal abnormalities                           K57.30, Diverticulosis of large intestine without                            perforation or abscess without bleeding CPT copyright 2016 American [employer]. All rights reserved. The codes documented in this report are preliminary and upon coder review may  be revised to meet current compliance requirements. Mauri Pole, MD 09/20/2017 9:49:23 AM This report has been signed electronically. Number of Addenda: 0

## 2017-09-20 NOTE — Transfer of Care (Signed)
Immediate Anesthesia Transfer of Care Note  Patient: [name]  Procedure(s) Performed: COLONOSCOPY WITH PROPOFOL (N/A )  Patient Location: PACU  Anesthesia Type:MAC  Level of Consciousness: awake, alert  and oriented  Airway & Oxygen Therapy: Patient Spontanous Breathing and Patient connected to face mask oxygen  Post-op Assessment: Report given to RN and Post -op Vital signs reviewed and stable  Post vital signs: Reviewed and stable  Last Vitals:  Vitals:   09/20/17 0800 09/20/17 0815  BP: (!) 215/92 (!) 184/87  Pulse: 90   Resp: 14   Temp: 36.6 C   SpO2: 100%     Last Pain:  Vitals:   09/20/17 0800  TempSrc: Oral         Complications: No apparent anesthesia complications

## 2017-09-20 NOTE — Anesthesia Postprocedure Evaluation (Signed)
Anesthesia Post Note  Patient: [name]  Procedure(s) Performed: COLONOSCOPY WITH PROPOFOL (N/A )     Patient location during evaluation: PACU Anesthesia Type: MAC Level of consciousness: awake and alert Pain management: pain level controlled Vital Signs Assessment: post-procedure vital signs reviewed and stable Respiratory status: spontaneous breathing, nonlabored ventilation, respiratory function stable and patient connected to nasal cannula oxygen Cardiovascular status: stable and blood pressure returned to baseline Postop Assessment: no apparent nausea or vomiting Anesthetic complications: no    Last Vitals:  Vitals:   09/20/17 0953 09/20/17 1000  BP: (!) 153/73 (!) 165/137  Pulse: 76 78  Resp: 18 (!) 22  Temp:    SpO2: 100% 100%    Last Pain:  Vitals:   09/20/17 0953  TempSrc: Oral                 Ryan P Ellender

## 2017-09-20 NOTE — Discharge Instructions (Signed)

## 2017-09-20 NOTE — H&P (Signed)
[facility] History and Physical   Primary Care Physician:  Elby Showers, MD   Reason for Procedure:   Positive cologaurd  Plan:    Colonoscopy with possible intervention     HPI: [name] is a 66 y.o. female with CAD, COPD on home O2 here for evaluation with positive cologaurd. Denies any nausea, vomiting, abdominal pain, melena or bright red blood per rectum    Past Medical History:  Diagnosis Date  . Allergy   . Arthritis   . Cancer [facility]) 2006   renal  . Chronic kidney disease    chronic kidney infections  . Dyspnea   . Emphysema of lung ([place])   . GERD (gastroesophageal reflux disease)   . Heart murmur   . Hyperlipidemia   . Hypertension   . Osteopenia   . Oxygen deficiency    3 liters most hours of the day    Past Surgical History:  Procedure Laterality Date  . CESAREAN SECTION     x 3  . NEPHRECTOMY  2006   partial  . URETHERAL RE-IMPLANTATION  2007    Prior to Admission medications   Medication Sig Start Date End Date Taking? Authorizing Provider  Aspirin-Acetaminophen-Caffeine (EXCEDRIN PO) Take 1 tablet daily as needed by mouth (MORNING HEADACHES.).    Yes [provider]  atenolol (TENORMIN) 25 MG tablet TAKE 1 TABLET EVERY DAY Patient taking differently: TAKE 1 TABLET EVERY DAY AT BEDTIME. 08/17/16  Yes Baxley, Cresenciano Lick, MD  Bioflavonoid Products (ESTER-C/BIOFLAVONOIDS) TABS Take 1 tablet at bedtime by mouth. 1000 MG   Yes [provider]  Butalbital-APAP-Caffeine 50-325-40 MG capsule TAKE 1 TO 2 CAPSULES BY MOUTH EVERY 6 HOURS AS NEEDED Patient taking differently: TAKE 1 CAPSULE BY MOUTH DAILY IN THE MORNING AS NEEDED FOR MORNING HEADACHES. 07/17/17  Yes Baxley, Cresenciano Lick, MD  Calcium Carbonate-Vitamin D (TGT CALCIUM DIETARY SUPPLEMENT PO) Take 4 tablets 2 (two) times daily after a meal by mouth. BIOST 1615 BY STANDARD PROCESS (TO SUPPORT CELLULAR & SKELETAL HEALTH)  TAKE 4 TABLETS BY MOUTH TWICE AFTER A MEAL   Yes  [provider]  carboxymethylcellulose (REFRESH PLUS) 0.5 % SOLN Place 1-2 drops 3 (three) times daily as needed into both eyes (FOR DRY/IRRITATED EYES.).   Yes [provider]  cetirizine (ZYRTEC ALLERGY) 10 MG tablet Take 10 mg at bedtime by mouth.   Yes [provider]  Cholecalciferol (VITAMIN D3) 2000 units TABS Take 2,000 Units daily by mouth.   Yes [provider]  Coenzyme Q10 (COQ10 MAXIMUM STRENGTH PO) Take 1 capsule daily with supper by mouth. QUNOL ULTRA COQ10   Yes [provider]  diphenhydrAMINE (BENADRYL) 25 MG tablet Take 25-50 mg every 6 (six) hours as needed by mouth (for congestion/allergies.).    Yes [provider]  Estradiol 10 MCG TABS vaginal tablet Place 1 tablet (10 mcg total) vaginally as directed. Patient taking differently: Place 10 mcg 2 (two) times a week vaginally.  04/18/17  Yes Baxley, Cresenciano Lick, MD  Eyelid Cleansers (OCUSOFT LID SCRUB EX) Apply 1 application 2 (two) times daily to eye.   Yes [provider]  fluconazole (DIFLUCAN) 150 MG tablet TAKE 1 TABLET EVERY OTHER DAY  FOR  3  DOSES Patient taking differently: TAKE 1 TABLET DAILY AS NEEDED FOR YEAST SORES ON TONGUE. 07/23/17  Yes Baxley, Cresenciano Lick, MD  GuaiFENesin (MUCINEX PO) Take 1 tablet 2 (two) times daily as needed by mouth (for congestion/mucus.).  Yes [provider]  Homeopathic Products (SLEEP MEDICINE PO) Take 1-2 tablets at bedtime by mouth. ALTERIL(Valerian Root, Melatonin, L-Tryptophan and L-Theanine)   Yes [provider]  hydrochlorothiazide (HYDRODIURIL) 25 MG tablet TAKE 1 TABLET EVERY DAY Patient taking differently: TAKE 0.5 TABLET (12.5 MG) BY MOUTH DAILY 10/17/16  Yes Elby Showers, MD  losartan (COZAAR) 25 MG tablet TAKE 1 TABLET EVERY DAY 07/23/17  Yes Baxley, Cresenciano Lick, MD  Misc Natural Products (TURMERIC CURCUMIN) CAPS Take 2 capsules daily by mouth.   Yes [provider]  Multiple Vitamin (MULTIVITAMIN  WITH MINERALS) TABS tablet Take 1 tablet at bedtime by mouth. Centrum Silver for Women   Yes [provider]  nitroGLYCERIN (NITROSTAT) 0.4 MG SL tablet Place 0.4 mg under the tongue every 5 (five) minutes as needed for chest pain.    Yes [provider]  NON FORMULARY Take 1 capsule 2 (two) times daily by mouth. TRUFIX (ALPHA LIPOIC ACID/CINNULIN/CHOLORGENIC ACID/CHROMIUM/COPPER/MAGNESIUM/RASPBERRY KETONES/SELENIUM/VANADIUM/ZINC)-- (IN THE MORNING & AT LUNCH)   Yes [provider]  Omega-3 Fatty Acids (FISH OIL) 1200 MG CAPS Take 1,200 mg at bedtime by mouth. PLUS 360 OMEGA 3   Yes [provider]  OVER THE COUNTER MEDICATION Take 1 tablet 2 (two) times daily by mouth. TRUWEIGHT & ENERGY(4-AMINO-2-METHYLPENTANE CITRATE/GREEN TEA EGCG EXTRACT/DENDROBIUM/BITTER ORANGE/PHENYLETHYLAMINE HCL/CAFFEINE/BIOPERINE/B6/B12/CHROMIUM)-- (IN THE MORNING & AT LUNCH)   Yes [provider]  pantoprazole (PROTONIX) 40 MG tablet TAKE 1 TABLET TWICE DAILY 10/17/16  Yes Baxley, Cresenciano Lick, MD  polyethylene glycol (MIRALAX / GLYCOLAX) packet Take 17 g daily by mouth. With coffee   Yes [provider]  promethazine (PHENERGAN) 25 MG tablet Take 25 mg by mouth every 6 (six) hours as needed for nausea or vomiting.   Yes [provider]  rosuvastatin (CRESTOR) 20 MG tablet Take 1 tablet (20 mg total) by mouth daily. Patient taking differently: Take 20 mg daily with supper by mouth.  02/28/16  Yes Baxley, Cresenciano Lick, MD  saccharomyces boulardii (FLORASTOR) 250 MG capsule Take 250 mg 2 (two) times daily by mouth.   Yes [provider]  Saline (BREATHE FREE NA) Take 2 capsules daily as needed by mouth (for allergies/environmental irritants.). Rootology Breathe Free (blend of 13 concentrated herbal extracts)   Yes [provider]  valACYclovir (VALTREX) 500 MG tablet TAKE 1 TABLET EVERY DAY 08/17/16  Yes Baxley, Cresenciano Lick, MD  econazole nitrate 1 % cream Apply to  foot rash daily until healed Patient not taking: Reported on 08/09/2017 04/18/17   Elby Showers, MD  mometasone-formoterol [facility]) 100-5 MCG/ACT AERO 2 puffs 2 times daily Patient not taking: Reported on 04/18/2017 10/19/16   Elby Showers, MD  Na Sulfate-K Sulfate-Mg Sulf (SUPREP BOWEL PREP KIT) 17.5-3.13-1.6 GM/180ML SOLN suprep as directed.  No substitutions 08/09/17   Mauri Pole, MD    No current facility-administered medications for this encounter.     Allergies as of 07/29/2017 - Review Complete 04/29/2017  Allergen Reaction Noted  . Nitrofurantoin macrocrystal Other (See Comments) 10/31/2015  . Thorazine [chlorpromazine hcl] Hives 01/19/2011    Family History  Problem Relation Age of Onset  . Hypothyroidism Mother   . Heart disease Mother   . Asthma Mother   . Hypertension Mother   . Heart disease Father   . Heart attack Father   . Hypertension Father   . Colon cancer Neg Hx     Social History   Socioeconomic History  . Marital status: Married  Spouse name: Not on file  . Number of children: Not on file  . Years of education: Not on file  . Highest education level: Not on file  Social Needs  . Financial resource strain: Not on file  . Food insecurity - worry: Not on file  . Food insecurity - inability: Not on file  . Transportation needs - medical: Not on file  . Transportation needs - non-medical: Not on file  Occupational History  . Occupation: retired    Fish farm manager: RETIRED  Tobacco Use  . Smoking status: Former Smoker    Packs/day: 2.00    Years: 35.00    Pack years: 70.00    Types: Cigarettes    Last attempt to quit: 11/05/2000    Years since quitting: 16.8  . Smokeless tobacco: Never Used  Substance and Sexual Activity  . Alcohol use: Yes    Comment: occ glass of red wine with steak  . Drug use: No  . Sexual activity: Yes    Birth control/protection: Post-menopausal  Other Topics Concern  . Not on file  Social History Narrative  . Not  on file    Review of Systems:  All other review of systems negative except as mentioned in the HPI.  Physical Exam: Vital signs in last 24 hours:     General:   Alert,  Well-developed, well-nourished, pleasant and cooperative in NAD Lungs:  Clear throughout to auscultation.   Heart:  Regular rate and rhythm; no murmurs, clicks, rubs,  or gallops. Abdomen:  Soft, nontender and nondistended. Normal bowel sounds.   Neuro/Psych:  Alert and cooperative. Normal mood and affect. A and O x 3   '@K'$ .Denzil Magnuson, MD [phone] Mon-Fri 8a-5p [phone] after 5p, weekends, holidays 09/20/2017 7:11 AM@

## 2017-09-23 ENCOUNTER — Encounter (HOSPITAL_COMMUNITY): Payer: Self-pay | Admitting: Gastroenterology

## 2017-09-23 ENCOUNTER — Other Ambulatory Visit: Payer: Self-pay | Admitting: Internal Medicine

## 2017-09-30 ENCOUNTER — Other Ambulatory Visit: Payer: Self-pay | Admitting: Internal Medicine

## 2017-10-04 ENCOUNTER — Encounter: Payer: Self-pay | Admitting: Gastroenterology

## 2017-10-10 ENCOUNTER — Other Ambulatory Visit: Payer: Self-pay | Admitting: Internal Medicine

## 2017-10-10 DIAGNOSIS — R7302 Impaired glucose tolerance (oral): Secondary | ICD-10-CM

## 2017-10-10 DIAGNOSIS — I10 Essential (primary) hypertension: Secondary | ICD-10-CM

## 2017-10-10 DIAGNOSIS — E785 Hyperlipidemia, unspecified: Secondary | ICD-10-CM

## 2017-10-10 DIAGNOSIS — Z1329 Encounter for screening for other suspected endocrine disorder: Secondary | ICD-10-CM

## 2017-10-10 DIAGNOSIS — Z0000 Encounter for general adult medical examination without abnormal findings: Secondary | ICD-10-CM

## 2017-10-10 DIAGNOSIS — Z1321 Encounter for screening for nutritional disorder: Secondary | ICD-10-CM

## 2017-10-22 ENCOUNTER — Ambulatory Visit (INDEPENDENT_AMBULATORY_CARE_PROVIDER_SITE_OTHER): Payer: Medicare Other | Admitting: Internal Medicine

## 2017-10-22 ENCOUNTER — Encounter: Payer: Self-pay | Admitting: Internal Medicine

## 2017-10-22 ENCOUNTER — Other Ambulatory Visit (HOSPITAL_COMMUNITY)
Admission: RE | Admit: 2017-10-22 | Discharge: 2017-10-22 | Disposition: A | Discharge status: Home / Self Care | Payer: Medicare Other | Source: Ambulatory Visit | Attending: Internal Medicine | Admitting: Internal Medicine

## 2017-10-22 VITALS — BP 158/90 | HR 78 | Ht 62.5 in | Wt 129.0 lb

## 2017-10-22 DIAGNOSIS — Z1321 Encounter for screening for nutritional disorder: Secondary | ICD-10-CM | POA: Diagnosis not present

## 2017-10-22 DIAGNOSIS — N760 Acute vaginitis: Secondary | ICD-10-CM | POA: Diagnosis not present

## 2017-10-22 DIAGNOSIS — N898 Other specified noninflammatory disorders of vagina: Secondary | ICD-10-CM | POA: Insufficient documentation

## 2017-10-22 DIAGNOSIS — R8290 Unspecified abnormal findings in urine: Secondary | ICD-10-CM

## 2017-10-22 DIAGNOSIS — R7302 Impaired glucose tolerance (oral): Secondary | ICD-10-CM

## 2017-10-22 DIAGNOSIS — Z1329 Encounter for screening for other suspected endocrine disorder: Secondary | ICD-10-CM | POA: Diagnosis not present

## 2017-10-22 DIAGNOSIS — Z0000 Encounter for general adult medical examination without abnormal findings: Secondary | ICD-10-CM | POA: Diagnosis not present

## 2017-10-22 DIAGNOSIS — E7800 Pure hypercholesterolemia, unspecified: Secondary | ICD-10-CM | POA: Diagnosis not present

## 2017-10-22 DIAGNOSIS — I10 Essential (primary) hypertension: Secondary | ICD-10-CM | POA: Diagnosis not present

## 2017-10-22 DIAGNOSIS — Z8669 Personal history of other diseases of the nervous system and sense organs: Secondary | ICD-10-CM

## 2017-10-22 DIAGNOSIS — Z87440 Personal history of urinary (tract) infections: Secondary | ICD-10-CM

## 2017-10-22 DIAGNOSIS — I519 Heart disease, unspecified: Secondary | ICD-10-CM

## 2017-10-22 DIAGNOSIS — Z124 Encounter for screening for malignant neoplasm of cervix: Secondary | ICD-10-CM | POA: Diagnosis not present

## 2017-10-22 DIAGNOSIS — J449 Chronic obstructive pulmonary disease, unspecified: Secondary | ICD-10-CM | POA: Diagnosis not present

## 2017-10-22 DIAGNOSIS — Z905 Acquired absence of kidney: Secondary | ICD-10-CM | POA: Diagnosis not present

## 2017-10-22 DIAGNOSIS — Z9981 Dependence on supplemental oxygen: Secondary | ICD-10-CM

## 2017-10-22 DIAGNOSIS — Z9889 Other specified postprocedural states: Secondary | ICD-10-CM | POA: Diagnosis not present

## 2017-10-22 DIAGNOSIS — E785 Hyperlipidemia, unspecified: Secondary | ICD-10-CM

## 2017-10-22 DIAGNOSIS — B9689 Other specified bacterial agents as the cause of diseases classified elsewhere: Secondary | ICD-10-CM

## 2017-10-22 LAB — POCT URINALYSIS DIPSTICK
Appearance: NORMAL
Bilirubin, UA: NEGATIVE
Blood, UA: NEGATIVE
Glucose, UA: NEGATIVE
Ketones, UA: NEGATIVE
Nitrite, UA: NEGATIVE
Odor: NORMAL
Protein, UA: NEGATIVE
Spec Grav, UA: 1.01 (ref 1.010–1.025)
Urobilinogen, UA: 0.2 E.U./dL
pH, UA: 6 (ref 5.0–8.0)

## 2017-10-22 LAB — POCT WET PREP (WET MOUNT)

## 2017-10-22 MED ORDER — CLINDAMYCIN PHOSPHATE 2 % VA CREA: 1.0000 | TOPICAL_CREAM | Freq: Every day | VAGINAL | 0 refills | Status: DC

## 2017-10-22 NOTE — Addendum Note (Signed)
Addended by: Mady Haagensen on: 10/22/2017 11:08 AM   Modules accepted: Orders

## 2017-10-22 NOTE — Addendum Note (Signed)
Addended by: Mady Haagensen on: 10/22/2017 11:09 AM   Modules accepted: Orders

## 2017-10-23 LAB — URINE CULTURE
MICRO NUMBER:: 81421854
SPECIMEN QUALITY:: ADEQUATE

## 2017-10-23 LAB — VITAMIN D 25 HYDROXY (VIT D DEFICIENCY, FRACTURES): Vit D, 25-Hydroxy: 69 ng/mL (ref 30–100)

## 2017-10-23 LAB — CBC WITH DIFFERENTIAL/PLATELET
Basophils Absolute: 30 cells/uL (ref 0–200)
Basophils Relative: 0.5 %
Eosinophils Absolute: 153 cells/uL (ref 15–500)
Eosinophils Relative: 2.6 %
HCT: 41.6 % (ref 35.0–45.0)
Hemoglobin: 14.4 g/dL (ref 11.7–15.5)
Lymphs Abs: 1322 cells/uL (ref 850–3900)
MCH: 31.3 pg (ref 27.0–33.0)
MCHC: 34.6 g/dL (ref 32.0–36.0)
MCV: 90.4 fL (ref 80.0–100.0)
MPV: 9.8 fL (ref 7.5–12.5)
Monocytes Relative: 9.4 %
Neutro Abs: 3841 cells/uL (ref 1500–7800)
Neutrophils Relative %: 65.1 %
Platelets: 217 10*3/uL (ref 140–400)
RBC: 4.6 10*6/uL (ref 3.80–5.10)
RDW: 12.6 % (ref 11.0–15.0)
Total Lymphocyte: 22.4 %
WBC mixed population: 555 cells/uL (ref 200–950)
WBC: 5.9 10*3/uL (ref 3.8–10.8)

## 2017-10-23 LAB — MICROALBUMIN / CREATININE URINE RATIO
Creatinine, Urine: 30 mg/dL (ref 20–275)
Microalb Creat Ratio: 50 mcg/mg creat — ABNORMAL HIGH (ref <30)
Microalb, Ur: 1.5 mg/dL

## 2017-10-23 LAB — HEMOGLOBIN A1C
Hgb A1c MFr Bld: 5.6 % of total Hgb (ref <5.7)
Mean Plasma Glucose: 114 (calc)
eAG (mmol/L): 6.3 (calc)

## 2017-10-23 LAB — LIPID PANEL
Cholesterol: 193 mg/dL (ref <200)
HDL: 81 mg/dL (ref >50)
LDL Cholesterol (Calc): 96 mg/dL (calc)
Non-HDL Cholesterol (Calc): 112 mg/dL (calc) (ref <130)
Total CHOL/HDL Ratio: 2.4 (calc) (ref <5.0)
Triglycerides: 75 mg/dL (ref <150)

## 2017-10-23 LAB — COMPLETE METABOLIC PANEL WITH GFR
AG Ratio: 1.6 (calc) (ref 1.0–2.5)
ALT: 17 U/L (ref 6–29)
AST: 27 U/L (ref 10–35)
Albumin: 4.3 g/dL (ref 3.6–5.1)
Alkaline phosphatase (APISO): 59 U/L (ref 33–130)
BUN: 18 mg/dL (ref 7–25)
CO2: 29 mmol/L (ref 20–32)
Calcium: 9.6 mg/dL (ref 8.6–10.4)
Chloride: 101 mmol/L (ref 98–110)
Creat: 0.79 mg/dL (ref 0.50–0.99)
GFR, Est African American: 90 mL/min/{1.73_m2} (ref >=60)
GFR, Est Non African American: 77 mL/min/{1.73_m2} (ref >=60)
Globulin: 2.7 g/dL (calc) (ref 1.9–3.7)
Glucose, Bld: 75 mg/dL (ref 65–99)
Potassium: 3.8 mmol/L (ref 3.5–5.3)
Sodium: 140 mmol/L (ref 135–146)
Total Bilirubin: 0.5 mg/dL (ref 0.2–1.2)
Total Protein: 7 g/dL (ref 6.1–8.1)

## 2017-10-23 LAB — TSH: TSH: 2.17 mIU/L (ref 0.40–4.50)

## 2017-10-24 LAB — CYTOLOGY - PAP: Diagnosis: NEGATIVE

## 2017-11-01 ENCOUNTER — Other Ambulatory Visit: Payer: Self-pay | Admitting: Internal Medicine

## 2017-11-01 NOTE — Telephone Encounter (Signed)
Please give #[phone] with one refill

## 2017-11-15 ENCOUNTER — Other Ambulatory Visit: Payer: Self-pay | Admitting: Internal Medicine
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
## 2017-12-26 ENCOUNTER — Encounter: Payer: Self-pay | Admitting: Cardiovascular Disease

## 2017-12-26 ENCOUNTER — Ambulatory Visit (INDEPENDENT_AMBULATORY_CARE_PROVIDER_SITE_OTHER): Payer: Medicare Other | Admitting: Cardiovascular Disease

## 2017-12-26 VITALS — BP 132/80 | HR 80 | Ht 63.0 in | Wt 129.4 lb

## 2017-12-26 DIAGNOSIS — R0600 Dyspnea, unspecified: Secondary | ICD-10-CM

## 2017-12-26 DIAGNOSIS — I10 Essential (primary) hypertension: Secondary | ICD-10-CM | POA: Diagnosis not present

## 2017-12-26 DIAGNOSIS — R609 Edema, unspecified: Secondary | ICD-10-CM | POA: Diagnosis not present

## 2017-12-26 NOTE — Patient Instructions (Signed)
Medication Instructions:  Your provider recommends that you continue on your current medications as directed. Please refer to the Current Medication list given to you today.    Labwork: None  Testing/Procedures: Your provider has requested that you have an echocardiogram. Echocardiography is a painless test that uses sound waves to create images of your heart. It provides your doctor with information about the size and shape of your heart and how well your heart's chambers and valves are working. This procedure takes approximately one hour. There are no restrictions for this procedure.  Follow-Up: Your provider wants you to follow-up in: 1 year with Dr. Burt Knack. You will receive a reminder letter in the mail two months in advance. If you don't receive a letter, please call our office to schedule the follow-up appointment.    Any Other Special Instructions Will Be Listed Below (If Applicable).   For your  leg edema you  should do  the following 1. Leg elevation - I recommend the Lounge Dr. Leg rest.  See below for details  2. Salt restriction  -  Use potassium chloride instead of regular salt as a salt substitute. 3. Walk regularly 4. Compression hose - [facility] 5. Weight loss    Available on [email or web address] Or  Go to [email or web address]         If you need a refill on your cardiac medications before your next appointment, please call your pharmacy.

## 2017-12-26 NOTE — Progress Notes (Signed)
Cardiology Office Note Date:  12/27/2017   ID:  [name], DOB June 28, 1951, MRN [id number]  PCP:  Krista Showers, MD  Cardiologist:  Krista Mocha, MD    Chief Complaint  Patient presents with  . Shortness of Breath     History of Present Illness: [name] is a 67 y.o. female who presents for follow-up of nonobstructive coronary artery disease.  She was last seen in March 2017.  She has no history of anginal chest pain.  The patient is on home O2 and reports no significant change in her shortness of breath.  She has developed leg swelling, primarily in the right leg on an intermittent basis.  She does not associate this with sodium intake.  She uses 2-3 pillows chronically but does not have clear symptoms of orthopnea or PND.   Past Medical History:  Diagnosis Date  . Allergy   . Arthritis   . Cancer [facility]) 2006   renal  . Chronic kidney disease    chronic kidney infections  . Dyspnea   . Emphysema of lung ([place])   . GERD (gastroesophageal reflux disease)   . Heart murmur   . Hyperlipidemia   . Hypertension   . Osteopenia   . Oxygen deficiency    3 liters most hours of the day    Past Surgical History:  Procedure Laterality Date  . CESAREAN SECTION     x 3  . COLONOSCOPY WITH PROPOFOL N/A 09/20/2017   Procedure: COLONOSCOPY WITH PROPOFOL;  Surgeon: Mauri Pole, MD;  Location: [facility] END[facility]COPY;  Service: Endoscopy;  Laterality: N/A;  . NEPHRECTOMY  2006   partial  . URETHERAL RE-IMPLANTATION  2007    Current Outpatient Medications  Medication Sig Dispense Refill  . atenolol (TENORMIN) 25 MG tablet Take 25 mg by mouth daily.    [name] Products (ESTER-C/BIOFLAVONOIDS) TABS Take 1 tablet at bedtime by mouth. 1000 MG    . Butalbital-APAP-Caffeine 50-300-40 MG CAPS Take 1-2 capsules by mouth every 6 (six) hours as needed (headache).    . Calcium Carbonate-Vitamin D (TGT CALCIUM DIETARY SUPPLEMENT PO) Take 4 tablets 2 (two) times daily after a meal  by mouth. BIOST 1615 BY STANDARD PROCESS (TO SUPPORT CELLULAR & SKELETAL HEALTH)  TAKE 4 TABLETS BY MOUTH TWICE AFTER A MEAL    . carboxymethylcellulose (REFRESH PLUS) 0.5 % SOLN Place 1-2 drops 3 (three) times daily as needed into both eyes (FOR DRY/IRRITATED EYES.).    Marland Kitchen cetirizine (ZYRTEC ALLERGY) 10 MG tablet Take 10 mg at bedtime by mouth.    . Cholecalciferol (VITAMIN D3) 2000 units TABS Take 2,000 Units daily by mouth.    . Coenzyme Q10 (COQ10 MAXIMUM STRENGTH PO) Take 1 capsule daily with supper by mouth. QUNOL ULTRA COQ10    . diphenhydrAMINE (BENADRYL) 25 MG tablet Take 25-50 mg every 6 (six) hours as needed by mouth (for congestion/allergies.).     Marland Kitchen Estradiol 10 MCG TABS vaginal tablet Place 10 mcg vaginally as directed.    . Eyelid Cleansers (OCUSOFT LID SCRUB EX) Apply 1 application 2 (two) times daily to eye.    . fluconazole (DIFLUCAN) 150 MG tablet TAKE 1 TABLET EVERY OTHER DAY  FOR  3  DOSES 6 tablet 1  . GuaiFENesin (MUCINEX PO) Take 1 tablet 2 (two) times daily as needed by mouth (for congestion/mucus.).     Marland Kitchen Homeopathic Products (SLEEP MEDICINE PO) Take 1-2 tablets at bedtime by mouth. ALTERIL(Valerian Root, Melatonin, L-Tryptophan and L-Theanine)    .  losartan (COZAAR) 25 MG tablet Take 12.5 mg by mouth daily.    . Misc Natural Products (TURMERIC CURCUMIN) CAPS Take 2 capsules daily by mouth.    . Multiple Vitamin (MULTIVITAMIN WITH MINERALS) TABS tablet Take 1 tablet at bedtime by mouth. Centrum Silver for Women    . nitroGLYCERIN (NITROSTAT) 0.4 MG SL tablet Place 0.4 mg under the tongue every 5 (five) minutes as needed for chest pain.     . NON FORMULARY Take 1 capsule 2 (two) times daily by mouth. TRUFIX (ALPHA LIPOIC ACID/CINNULIN/CHOLORGENIC ACID/CHROMIUM/COPPER/MAGNESIUM/RASPBERRY KETONES/SELENIUM/VANADIUM/ZINC)-- (IN THE MORNING & AT LUNCH)    . Omega-3 Fatty Acids (FISH OIL) 1200 MG CAPS Take 1,200 mg at bedtime by mouth. PLUS 360 OMEGA 3    . OVER THE COUNTER  MEDICATION Take 1 tablet 2 (two) times daily by mouth. TRUWEIGHT & ENERGY(4-AMINO-2-METHYLPENTANE CITRATE/GREEN TEA EGCG EXTRACT/DENDROBIUM/BITTER ORANGE/PHENYLETHYLAMINE HCL/CAFFEINE/BIOPERINE/B6/B12/CHROMIUM)-- (IN THE MORNING & AT LUNCH)    . pantoprazole (PROTONIX) 40 MG tablet Take 40 mg by mouth 2 (two) times daily.    . polyethylene glycol (MIRALAX / GLYCOLAX) packet Take 17 g daily by mouth. With coffee    . promethazine (PHENERGAN) 25 MG tablet Take 25 mg by mouth every 6 (six) hours as needed for nausea or vomiting.    . rosuvastatin (CRESTOR) 20 MG tablet Take 1 tablet (20 mg total) by mouth daily. (Patient taking differently: Take 20 mg daily with supper by mouth. ) 90 tablet 3  . saccharomyces boulardii (FLORASTOR) 250 MG capsule Take 250 mg 2 (two) times daily by mouth.    . Saline (BREATHE FREE NA) Take 2 capsules daily as needed by mouth (for allergies/environmental irritants.). Rootology Breathe Free (blend of 13 concentrated herbal extracts)    . valACYclovir (VALTREX) 500 MG tablet Take 500 mg by mouth daily.     No current facility-administered medications for this visit.     Allergies:   Nitrofurantoin macrocrystal and Thorazine [chlorpromazine hcl]   Social History:  The patient  reports that she quit smoking about 17 years ago. Her smoking use included cigarettes. She has a 70.00 pack-year smoking history. she has never used smokeless tobacco. She reports that she drinks alcohol. She reports that she does not use drugs.   Family History:  The patient's family history includes Asthma in her mother; Heart attack in her father; Heart disease in her father and mother; Hypertension in her father and mother; Hypothyroidism in her mother.    ROS:  Please see the history of present illness.  Otherwise, review of systems is positive for headaches.  All other systems are reviewed and negative.    PHYSICAL EXAM: VS:  BP 132/80   Pulse 80   Ht 5\' 3"  (1.6 m)   Wt 129 lb 6.4 oz  (58.7 kg)   SpO2 98%   BMI 22.92 kg/m  , BMI Body mass index is 22.92 kg/m. GEN: Well nourished, well developed, in no acute distress  HEENT: normal  Neck: no JVD, no masses. No carotid bruits Cardiac: RRR without murmur or gallop                Respiratory:  clear to auscultation bilaterally, normal work of breathing GI: soft, nontender, nondistended, + BS MS: no deformity or atrophy  Ext: no pretibial edema, pedal pulses 2+= bilaterally. Mild varicosities and spider veins noted bilaterally Skin: warm and dry, no rash Neuro:  Strength and sensation are intact Psych: euthymic mood, full affect  EKG:  EKG is not  ordered today.  Recent Labs: 10/22/2017: ALT 17; BUN 18; Creat 0.79; Hemoglobin 14.4; Platelets 217; Potassium 3.8; Sodium 140; TSH 2.17   Lipid Panel     Component Value Date/Time   CHOL 193 10/22/2017 1115   TRIG 75 10/22/2017 1115   HDL 81 10/22/2017 1115   CHOLHDL 2.4 10/22/2017 1115   VLDL 16 04/16/2017 1115   LDLCALC 114 (H) 04/16/2017 1115      Wt Readings from Last 3 Encounters:  12/26/17 129 lb 6.4 oz (58.7 kg)  10/22/17 129 lb (58.5 kg)  09/20/17 128 lb (58.1 kg)    ASSESSMENT AND PLAN: 1.  CAD, native vessel, without angina: The patient is stable on her current medical regimen.  No changes are recommended today.  2. HTN: BP well-controlled on HCTZ, losartan, and atenolol.   3. Hyperlipidemia: treated with crestor  4.  Leg swelling.  Suspect venous insufficiency of the primary etiology.  Symptoms are fairly mild.  We discussed compression and she has some compression stockings at home that she will try.  We also reviewed leg elevation strategies.  I am going to check an echocardiogram to make sure that she has not developed any problems with right-sided heart failure in the context of her O2 dependent lung disease.  Her last echocardiogram was 5 years ago.  Current medicines are reviewed with the patient today.  The patient does not have concerns  regarding medicines.  Labs/ tests ordered today include:   Orders Placed This Encounter  Procedures  . ECHOCARDIOGRAM COMPLETE    Disposition:   FU one year  Signed, Krista Mocha, MD  12/27/2017 5:51 PM    [facility] [place], [place], [place]  [address] Phone: [phone]; Fax: [phone]

## 2018-01-01 ENCOUNTER — Ambulatory Visit (HOSPITAL_COMMUNITY): Payer: Medicare Other

## 2018-01-17 ENCOUNTER — Encounter: Payer: Self-pay | Admitting: Internal Medicine

## 2018-01-17 ENCOUNTER — Ambulatory Visit (INDEPENDENT_AMBULATORY_CARE_PROVIDER_SITE_OTHER): Payer: Medicare Other | Admitting: Internal Medicine

## 2018-01-17 VITALS — BP 130/78 | HR 88 | Temp 98.4°F | Ht 63.0 in | Wt 129.2 lb

## 2018-01-17 DIAGNOSIS — N390 Urinary tract infection, site not specified: Secondary | ICD-10-CM

## 2018-01-17 DIAGNOSIS — R300 Dysuria: Secondary | ICD-10-CM

## 2018-01-17 DIAGNOSIS — J449 Chronic obstructive pulmonary disease, unspecified: Secondary | ICD-10-CM | POA: Diagnosis not present

## 2018-01-17 DIAGNOSIS — R0600 Dyspnea, unspecified: Secondary | ICD-10-CM | POA: Diagnosis not present

## 2018-01-17 LAB — POCT URINALYSIS DIPSTICK
Appearance: NORMAL
Bilirubin, UA: NEGATIVE
Glucose, UA: NEGATIVE
Ketones, UA: NEGATIVE
Nitrite, UA: NEGATIVE
Odor: NORMAL
Protein, UA: NEGATIVE
Spec Grav, UA: 1.01 (ref 1.010–1.025)
Urobilinogen, UA: 0.2 E.U./dL
pH, UA: 6.5 (ref 5.0–8.0)

## 2018-01-17 MED ORDER — NITROFURANTOIN MONOHYD MACRO 100 MG PO CAPS: 100.0000 mg | ORAL_CAPSULE | Freq: Two times a day (BID) | ORAL | 0 refills | Status: DC

## 2018-01-17 NOTE — Progress Notes (Signed)
   Subjective:    Patient ID: [name], female    DOB: 02-Jul-1951, 67 y.o.   MRN: [id number]  HPI  Recent trip driving to [place]. Has developed urinary tract infection symptoms.  History of recurrent urinary tract infections.  History of ESBL urinary infection in 2012.  Says Macrodantin sometimes causes chest pressure and shortness of breath but works well for her.  In 2005 was diagnosed with grade 4 ureteral reflux of the left kidney and a complicated 3.5 cm cyst.  She underwent partial nephrectomy for a large septated cyst that contained a focus of renal cell carcinoma.  She also had reimplantation of the left ureter in 2007.   Review of Systems see above no fever or shaking chills     Objective:   Physical Exam  No CVA tenderness.  Urinalysis is abnormal.  Culture sent.      Assessment & Plan:  Acute UTI  Plan: She really does not feel well and would like to start an antibiotic today.  She is willing to try Macrobid.  Prescribed Macrobid 100 mg twice daily for 10 days.  Addendum January 20, 2018: Culture reveals E. coli sensitive to Macrodantin.

## 2018-01-20 ENCOUNTER — Telehealth: Payer: Self-pay | Admitting: Internal Medicine

## 2018-01-20 LAB — URINE CULTURE
MICRO NUMBER:: 90331413
SPECIMEN QUALITY:: ADEQUATE

## 2018-01-20 MED ORDER — MOMETASONE FURO-FORMOTEROL FUM 100-5 MCG/ACT IN AERO: 2.0000 | INHALATION_SPRAY | Freq: Two times a day (BID) | RESPIRATORY_TRACT | 99 refills | Status: AC

## 2018-01-20 NOTE — Addendum Note (Signed)
Addended by: Harland German A on: 01/20/2018 03:18 PM   Modules accepted: Orders

## 2018-01-20 NOTE — Telephone Encounter (Signed)
Pt requests refill on Dulera. Sent to [facility].

## 2018-01-20 NOTE — Patient Instructions (Signed)
Start Macrodantin 100 mg twice daily for 10 days.  Culture pending.

## 2018-01-27 ENCOUNTER — Telehealth: Payer: Self-pay

## 2018-01-27 NOTE — Telephone Encounter (Signed)
Completed a PA for DULERA will be hearing back in 24-72hrs.

## 2018-01-28 ENCOUNTER — Other Ambulatory Visit (HOSPITAL_COMMUNITY): Payer: Medicare Other

## 2018-02-08 ENCOUNTER — Other Ambulatory Visit: Payer: Self-pay | Admitting: Internal Medicine

## 2018-02-10 ENCOUNTER — Other Ambulatory Visit: Payer: Self-pay | Admitting: Internal Medicine

## 2018-02-12 ENCOUNTER — Telehealth: Payer: Self-pay

## 2018-02-12 NOTE — Telephone Encounter (Signed)
-----   Message from [name] sent at 02/12/2018  2:04 PM EDT ----- Regarding: echocardiogram Spoke with patient today regarding echocardiogram. She states she can not afford the test at this time due to insurance coverage.    Lorriane Shire

## 2018-02-17 ENCOUNTER — Other Ambulatory Visit: Payer: Self-pay | Admitting: Internal Medicine

## 2018-02-17 NOTE — Telephone Encounter (Signed)
Please call this in-- cannot e-scribe this SY

## 2018-03-07 ENCOUNTER — Ambulatory Visit (INDEPENDENT_AMBULATORY_CARE_PROVIDER_SITE_OTHER): Payer: Medicare Other | Admitting: Internal Medicine

## 2018-03-07 ENCOUNTER — Encounter: Payer: Self-pay | Admitting: Internal Medicine

## 2018-03-07 VITALS — BP 160/90 | HR 84 | Temp 98.2°F | Ht 63.0 in | Wt 149.0 lb

## 2018-03-07 DIAGNOSIS — N390 Urinary tract infection, site not specified: Secondary | ICD-10-CM | POA: Diagnosis not present

## 2018-03-07 DIAGNOSIS — R8290 Unspecified abnormal findings in urine: Secondary | ICD-10-CM

## 2018-03-07 DIAGNOSIS — R300 Dysuria: Secondary | ICD-10-CM | POA: Diagnosis not present

## 2018-03-07 LAB — POCT URINALYSIS DIPSTICK
Bilirubin, UA: NEGATIVE
Blood, UA: NEGATIVE
Glucose, UA: NEGATIVE
Ketones, UA: NEGATIVE
Nitrite, UA: NEGATIVE
Protein, UA: NEGATIVE
Spec Grav, UA: 1.01 (ref 1.010–1.025)
Urobilinogen, UA: 0.2 E.U./dL
pH, UA: 6.5 (ref 5.0–8.0)

## 2018-03-07 MED ORDER — FLUCONAZOLE 150 MG PO TABS: ORAL_TABLET | ORAL | 1 refills | Status: DC

## 2018-03-07 MED ORDER — BUTALBITAL-APAP-CAFFEINE 50-325-40 MG PO CAPS: ORAL_CAPSULE | ORAL | 1 refills | Status: DC

## 2018-03-07 MED ORDER — BUTALBITAL-APAP-CAFFEINE 50-325-40 MG PO CAPS: ORAL_CAPSULE | ORAL | 0 refills | Status: AC

## 2018-03-10 ENCOUNTER — Telehealth: Payer: Self-pay | Admitting: Internal Medicine

## 2018-03-10 ENCOUNTER — Encounter: Payer: Self-pay | Admitting: Internal Medicine

## 2018-03-10 LAB — URINE CULTURE
MICRO NUMBER:: 90542578
SPECIMEN QUALITY:: ADEQUATE

## 2018-03-10 MED ORDER — LEVOFLOXACIN 500 MG PO TABS: 500.0000 mg | ORAL_TABLET | Freq: Every day | ORAL | 0 refills | Status: DC

## 2018-03-10 NOTE — Telephone Encounter (Signed)
Called pt with urine culture results showing E coli. Sensitivities dicussed. We decided on Levaquin for at least 7 days. Rx e scribed to pharmacy.

## 2018-03-10 NOTE — Progress Notes (Signed)
   Subjective:    Patient ID: Wellington [place], female    DOB: Jul 15, 1951, 67 y.o.   MRN: [id number]  HPI Onset yesterday of UTI symptoms. Has seen pus in urine. Has malaise and fatigue. No shaking chills nausea or vomiting.BP elevated but pt not feeling well. Situational stress discussed. Moving to [place] in about 3 weeks. Fioricet for headache refilled in anticipation of move to [place].    Review of Systemssee above. Diflucan also refilled.     Objective:   Physical Exam  No CVA tenderness. Dipstick U/A abnormal. Culture sent.      Assessment & Plan:  Due to history of resistant organisms, pt prefers to wait on culture sensitivities before we select an antibiotic.

## 2018-03-10 NOTE — Patient Instructions (Signed)
Await culture results

## 2018-03-11 ENCOUNTER — Telehealth: Payer: Self-pay | Admitting: Internal Medicine

## 2018-03-11 ENCOUNTER — Ambulatory Visit (INDEPENDENT_AMBULATORY_CARE_PROVIDER_SITE_OTHER): Payer: Medicare Other | Admitting: Internal Medicine

## 2018-03-11 VITALS — BP 160/90 | HR 88 | Temp 98.1°F

## 2018-03-11 DIAGNOSIS — T7840XA Allergy, unspecified, initial encounter: Secondary | ICD-10-CM | POA: Diagnosis not present

## 2018-03-11 DIAGNOSIS — N390 Urinary tract infection, site not specified: Secondary | ICD-10-CM | POA: Diagnosis not present

## 2018-03-11 MED ORDER — SULFAMETHOXAZOLE-TRIMETHOPRIM 800-160 MG PO TABS: 1.0000 | ORAL_TABLET | Freq: Two times a day (BID) | ORAL | 1 refills | Status: DC

## 2018-03-11 MED ORDER — METHYLPREDNISOLONE ACETATE 80 MG/ML IJ SUSP
80.0000 mg | Freq: Once | INTRAMUSCULAR | Status: AC
  Administered 2018-03-11: 80 mg via INTRAMUSCULAR

## 2018-03-11 NOTE — Telephone Encounter (Signed)
Pt stopped by for Depo injection

## 2018-03-11 NOTE — Patient Instructions (Addendum)
Depo-Medrol 80 mg IM.  Switch to Bactrim DS twice daily for 10 days.  Consider Bactrim after intercourse one time dose to prevent UTIs although it may cause some drug resistance.  Discontinue Levaquin.

## 2018-03-11 NOTE — Progress Notes (Signed)
   Subjective:    Patient ID: [name], female    DOB: [birth year], 67 y.o.   MRN: [id number]  HPI She was put on Levaquin for urinary tract infection on May 6 after culture was back.  She was seen here with a UTI on May 3 but we waited for culture results.  She has been in her garage which she says is dusty and moldy.  She noticed redness and swelling of her right eyelid and some of the left eyelid as well.  She thought it might be due to an allergy.  She took Benadryl at my suggestion.  This is not improved a great deal.  It could be an allergy to Levaquin.  It is not clear to me.  However her options for urinary tract infection treatment  are limited.  Reviewed the culture results with her and sensitivities.  She has noticed a relationship with the UTI to sexual intercourse.  At one point several years ago she had an ESBL infection which was somewhat difficult to eradicate so we have been careful with antibiotics.    Review of Systems see above     Objective:   Physical Exam Red swollen eyelid.  Left eyelid slightly swollen but not very red.  No trouble with wheezing today.  She uses home oxygen for COPD       Assessment & Plan:  Acute UTI  ?  Allergic reaction to Levaquin  Plan: Change to Bactrim DS twice daily for 10 days.  Discontinue Levaquin.  Depo-Medrol 80 mg IM for eye swelling.  She may consider taking the Bactrim after intercourse to see if prevent UTIs in the future.

## 2018-03-11 NOTE — Telephone Encounter (Signed)
LVM to CB to schedule Depo shot

## 2018-03-12 ENCOUNTER — Telehealth: Payer: Self-pay | Admitting: Internal Medicine

## 2018-03-12 MED ORDER — CLOTRIMAZOLE-BETAMETHASONE 1-0.05 % EX CREA: 1.0000 "application " | TOPICAL_CREAM | Freq: Two times a day (BID) | CUTANEOUS | 0 refills | Status: DC

## 2018-03-12 NOTE — Telephone Encounter (Signed)
Still with apthous ulcers in mouth. Has developed "cracks" in corners of mouth. Call in Lotrisone cream to use in corners of mouth bid until healed. Dx Cheilitis

## 2018-03-24 ENCOUNTER — Ambulatory Visit (INDEPENDENT_AMBULATORY_CARE_PROVIDER_SITE_OTHER): Payer: Medicare Other | Admitting: Internal Medicine

## 2018-03-24 ENCOUNTER — Encounter: Payer: Self-pay | Admitting: Internal Medicine

## 2018-03-24 VITALS — BP 160/90 | HR 88 | Ht 63.0 in | Wt 136.0 lb

## 2018-03-24 DIAGNOSIS — Z9981 Dependence on supplemental oxygen: Secondary | ICD-10-CM | POA: Diagnosis not present

## 2018-03-24 DIAGNOSIS — Z87898 Personal history of other specified conditions: Secondary | ICD-10-CM

## 2018-03-24 DIAGNOSIS — Z79899 Other long term (current) drug therapy: Secondary | ICD-10-CM | POA: Diagnosis not present

## 2018-03-24 DIAGNOSIS — T378X5A Adverse effect of other specified systemic anti-infectives and antiparasitics, initial encounter: Secondary | ICD-10-CM

## 2018-03-24 DIAGNOSIS — Z8669 Personal history of other diseases of the nervous system and sense organs: Secondary | ICD-10-CM

## 2018-03-24 DIAGNOSIS — J449 Chronic obstructive pulmonary disease, unspecified: Secondary | ICD-10-CM

## 2018-03-24 DIAGNOSIS — E785 Hyperlipidemia, unspecified: Secondary | ICD-10-CM | POA: Diagnosis not present

## 2018-03-24 DIAGNOSIS — I519 Heart disease, unspecified: Secondary | ICD-10-CM

## 2018-03-24 DIAGNOSIS — E7800 Pure hypercholesterolemia, unspecified: Secondary | ICD-10-CM | POA: Diagnosis not present

## 2018-03-24 DIAGNOSIS — Z5181 Encounter for therapeutic drug level monitoring: Secondary | ICD-10-CM | POA: Diagnosis not present

## 2018-03-24 DIAGNOSIS — I10 Essential (primary) hypertension: Secondary | ICD-10-CM

## 2018-03-24 DIAGNOSIS — Z9889 Other specified postprocedural states: Secondary | ICD-10-CM | POA: Diagnosis not present

## 2018-03-24 DIAGNOSIS — R7302 Impaired glucose tolerance (oral): Secondary | ICD-10-CM

## 2018-03-24 DIAGNOSIS — Z87440 Personal history of urinary (tract) infections: Secondary | ICD-10-CM

## 2018-03-24 MED ORDER — PREDNISONE 10 MG PO TABS: ORAL_TABLET | ORAL | 0 refills | Status: DC

## 2018-03-24 MED ORDER — NITROFURANTOIN MONOHYD MACRO 100 MG PO CAPS: 100.0000 mg | ORAL_CAPSULE | Freq: Two times a day (BID) | ORAL | 0 refills | Status: DC

## 2018-03-24 NOTE — Progress Notes (Signed)
   Subjective:    Patient ID: [name], female    DOB: 04/21/1951, 67 y.o.   MRN: [id number]  HPI Patient in today for 18-month follow-up.  She and her husband are moving to [place] very soon.  Lipid panel shows an LDL of 113 and previously was 96 in December.  Triglycerides are normal at 77.  Total cholesterol is 189.  Hemoglobin A1c is 5.6% with no evidence of diabetes.  She had a recent UTI and apparent allergic reaction to Levaquin with eyes swelling and itching.  She was subsequently treated with Bactrim and improved.  Dipstick UA today is normal.  Liver panel is normal on statin medication.  Urine culture on May 3 grew E. coli.  She is been busy preparing for her move.  In 2016 she had hemoglobin A1c of 5.7% and at one point several years ago was 5.8%.    Review of Systems see above     Objective:   Physical Exam Spent 25 minutes speaking with her about these issues.  She will continue with same chronic medications. Blood pressure is 160/90 today but she will continue to monitor it at home and continue with same medications.  BMI is 24.09. Chest clear.  Cardiac exam regular rate and rhythm.     Assessment & Plan:  COPD on home oxygen therapy-stable  Hypertension-elevated today likely due to anxiety and stress with moving.  She will continue to monitor and we will not change her medication at the present time  Hyperlipidemia-stable  History of impaired glucose tolerance- hemoglobin A1c is been followed down regularly and has not been elevated since 2016  History of recurrent urinary tract infections-dipstick UA today is normal status post treatment for E. coli UTI with Bactrim.  We will be happy to transfer records to position of her choice in [place].  She may call or email me with any concerns in the meantime.

## 2018-03-25 LAB — HEPATIC FUNCTION PANEL
AG Ratio: 1.5 (calc) (ref 1.0–2.5)
ALT: 19 U/L (ref 6–29)
AST: 23 U/L (ref 10–35)
Albumin: 4.1 g/dL (ref 3.6–5.1)
Alkaline phosphatase (APISO): 59 U/L (ref 33–130)
Bilirubin, Direct: 0.1 mg/dL (ref 0.0–0.2)
Globulin: 2.7 g/dL (calc) (ref 1.9–3.7)
Indirect Bilirubin: 0.4 mg/dL (calc) (ref 0.2–1.2)
Total Bilirubin: 0.5 mg/dL (ref 0.2–1.2)
Total Protein: 6.8 g/dL (ref 6.1–8.1)

## 2018-03-25 LAB — MICROALBUMIN / CREATININE URINE RATIO
Creatinine, Urine: 57 mg/dL (ref 20–275)
Microalb Creat Ratio: 33 mcg/mg creat — ABNORMAL HIGH (ref <30)
Microalb, Ur: 1.9 mg/dL

## 2018-03-25 LAB — HEMOGLOBIN A1C
Hgb A1c MFr Bld: 5.6 % of total Hgb (ref <5.7)
Mean Plasma Glucose: 114 (calc)
eAG (mmol/L): 6.3 (calc)

## 2018-03-25 LAB — LIPID PANEL
Cholesterol: 189 mg/dL (ref <200)
HDL: 59 mg/dL (ref >50)
LDL Cholesterol (Calc): 113 mg/dL (calc) — ABNORMAL HIGH
Non-HDL Cholesterol (Calc): 130 mg/dL (calc) — ABNORMAL HIGH (ref <130)
Total CHOL/HDL Ratio: 3.2 (calc) (ref <5.0)
Triglycerides: 77 mg/dL (ref <150)

## 2018-03-31 ENCOUNTER — Encounter: Payer: Self-pay | Admitting: Internal Medicine

## 2018-03-31 NOTE — Patient Instructions (Signed)
It has been a pleasure to take care of you.  Good luck with your move to [place] and call with any questions or concerns.

## 2018-04-16 ENCOUNTER — Other Ambulatory Visit: Payer: Self-pay | Admitting: Internal Medicine

## 2018-07-19 DIAGNOSIS — Z23 Encounter for immunization: Secondary | ICD-10-CM | POA: Diagnosis not present

## 2018-07-25 DIAGNOSIS — R1013 Epigastric pain: Secondary | ICD-10-CM | POA: Diagnosis not present

## 2018-08-19 ENCOUNTER — Other Ambulatory Visit: Payer: Self-pay | Admitting: Internal Medicine

## 2018-08-24 ENCOUNTER — Telehealth: Payer: Self-pay | Admitting: Internal Medicine

## 2018-08-24 MED ORDER — HYOSCYAMINE SULFATE SL 0.125 MG SL SUBL: 1.0000 | SUBLINGUAL_TABLET | Freq: Three times a day (TID) | SUBLINGUAL | 2 refills | Status: DC

## 2018-08-24 NOTE — Telephone Encounter (Signed)
Pt having GI issues but cannot get into see doctor there until January. Went to Urgent Care and was told she had a virus. Having hyperacidity despite Protonix generic. Advised try 2 Nexium OTC daily Having diarrhea and some fecal incontinence. Try Levsin sublingual before meals. May need to be checked for parasite.

## 2018-09-02 ENCOUNTER — Encounter: Payer: Self-pay | Admitting: Internal Medicine

## 2018-09-02 ENCOUNTER — Telehealth: Payer: Self-pay | Admitting: Internal Medicine

## 2018-09-02 MED ORDER — ESOMEPRAZOLE MAGNESIUM 40 MG PO CPDR: DELAYED_RELEASE_CAPSULE | ORAL | 1 refills | Status: DC

## 2018-09-02 NOTE — Telephone Encounter (Signed)
OTC Nexium 2 daily helping GERD. Would like Rx called in

## 2018-09-08 ENCOUNTER — Other Ambulatory Visit: Payer: Self-pay | Admitting: Internal Medicine

## 2018-09-12 DIAGNOSIS — I878 Other specified disorders of veins: Secondary | ICD-10-CM | POA: Diagnosis not present

## 2018-09-12 DIAGNOSIS — I10 Essential (primary) hypertension: Secondary | ICD-10-CM | POA: Diagnosis not present

## 2018-09-12 DIAGNOSIS — J449 Chronic obstructive pulmonary disease, unspecified: Secondary | ICD-10-CM | POA: Diagnosis not present

## 2018-10-21 ENCOUNTER — Other Ambulatory Visit: Payer: Self-pay | Admitting: Internal Medicine

## 2018-10-31 NOTE — Addendum Note (Signed)
Addended by: Mady Haagensen on: 10/31/2018 09:56 AM   Modules accepted: Orders

## 2018-11-19 ENCOUNTER — Other Ambulatory Visit: Payer: Self-pay

## 2018-11-19 MED ORDER — ESOMEPRAZOLE MAGNESIUM 40 MG PO CPDR: DELAYED_RELEASE_CAPSULE | ORAL | 3 refills | Status: DC

## 2018-11-20 ENCOUNTER — Other Ambulatory Visit: Payer: Self-pay

## 2018-11-20 MED ORDER — ESOMEPRAZOLE MAGNESIUM 40 MG PO CPDR: DELAYED_RELEASE_CAPSULE | ORAL | 3 refills | Status: DC

## 2018-12-04 DIAGNOSIS — E782 Mixed hyperlipidemia: Secondary | ICD-10-CM | POA: Diagnosis not present

## 2018-12-04 DIAGNOSIS — C642 Malignant neoplasm of left kidney, except renal pelvis: Secondary | ICD-10-CM | POA: Diagnosis not present

## 2018-12-04 DIAGNOSIS — Z6824 Body mass index (BMI) 24.0-24.9, adult: Secondary | ICD-10-CM | POA: Diagnosis not present

## 2018-12-04 DIAGNOSIS — J449 Chronic obstructive pulmonary disease, unspecified: Secondary | ICD-10-CM | POA: Diagnosis not present

## 2018-12-04 DIAGNOSIS — I10 Essential (primary) hypertension: Secondary | ICD-10-CM | POA: Diagnosis not present

## 2018-12-04 DIAGNOSIS — M8580 Other specified disorders of bone density and structure, unspecified site: Secondary | ICD-10-CM | POA: Diagnosis not present

## 2018-12-04 DIAGNOSIS — R7309 Other abnormal glucose: Secondary | ICD-10-CM | POA: Diagnosis not present

## 2018-12-04 DIAGNOSIS — Z1231 Encounter for screening mammogram for malignant neoplasm of breast: Secondary | ICD-10-CM | POA: Diagnosis not present

## 2018-12-04 DIAGNOSIS — E559 Vitamin D deficiency, unspecified: Secondary | ICD-10-CM | POA: Diagnosis not present

## 2018-12-04 DIAGNOSIS — C649 Malignant neoplasm of unspecified kidney, except renal pelvis: Secondary | ICD-10-CM | POA: Diagnosis not present

## 2018-12-04 DIAGNOSIS — Z1211 Encounter for screening for malignant neoplasm of colon: Secondary | ICD-10-CM | POA: Diagnosis not present

## 2018-12-04 DIAGNOSIS — I447 Left bundle-branch block, unspecified: Secondary | ICD-10-CM | POA: Diagnosis not present

## 2018-12-23 DIAGNOSIS — R6889 Other general symptoms and signs: Secondary | ICD-10-CM | POA: Diagnosis not present

## 2018-12-23 DIAGNOSIS — J441 Chronic obstructive pulmonary disease with (acute) exacerbation: Secondary | ICD-10-CM | POA: Diagnosis not present

## 2018-12-25 ENCOUNTER — Encounter: Payer: Self-pay | Admitting: Gastroenterology

## 2018-12-28 DIAGNOSIS — J209 Acute bronchitis, unspecified: Secondary | ICD-10-CM | POA: Diagnosis not present

## 2018-12-28 DIAGNOSIS — J441 Chronic obstructive pulmonary disease with (acute) exacerbation: Secondary | ICD-10-CM | POA: Diagnosis not present

## 2018-12-28 DIAGNOSIS — R05 Cough: Secondary | ICD-10-CM | POA: Diagnosis not present

## 2019-01-06 DIAGNOSIS — J449 Chronic obstructive pulmonary disease, unspecified: Secondary | ICD-10-CM | POA: Diagnosis not present

## 2019-01-06 DIAGNOSIS — J9611 Chronic respiratory failure with hypoxia: Secondary | ICD-10-CM | POA: Diagnosis not present

## 2019-07-04 IMAGING — MG 2D DIGITAL SCREENING BILATERAL MAMMOGRAM WITH CAD AND ADJUNCT TO
9 of 15 series · 9 of 31 positions shown · non-contrast
Comparison: None.

CLINICAL DATA: Screening.

EXAM:
2D DIGITAL SCREENING BILATERAL MAMMOGRAM WITH CAD AND ADJUNCT TOMO

[L MLO (1 of 2)]
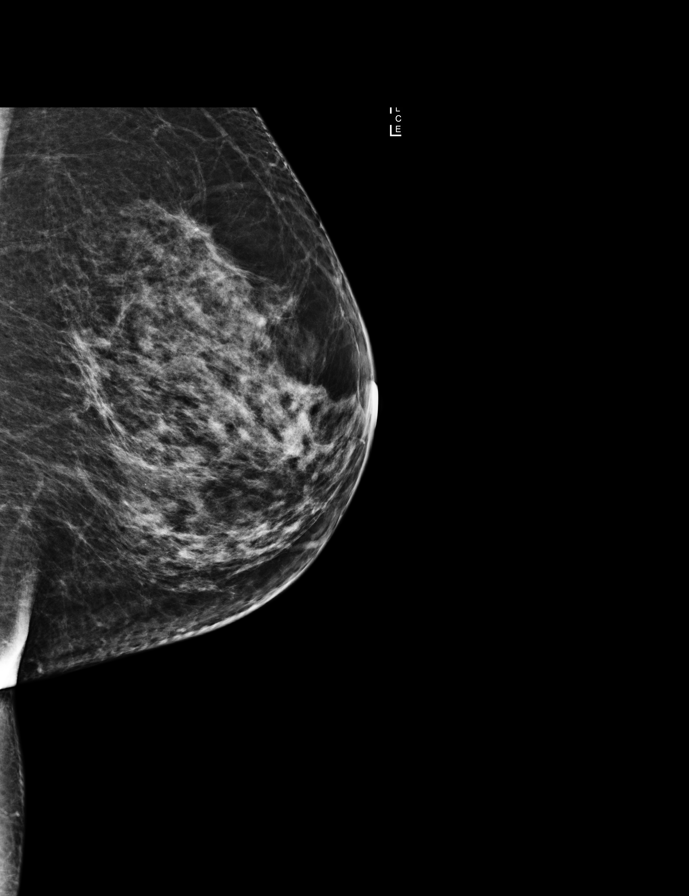

[L MLO (2 of 2)]
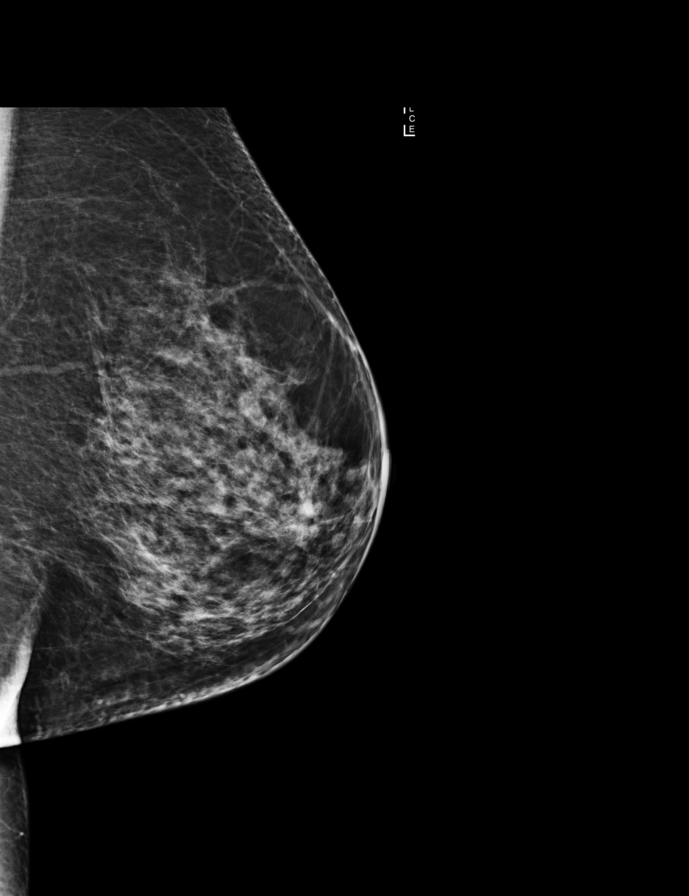

[R MLO (1 of 2)]
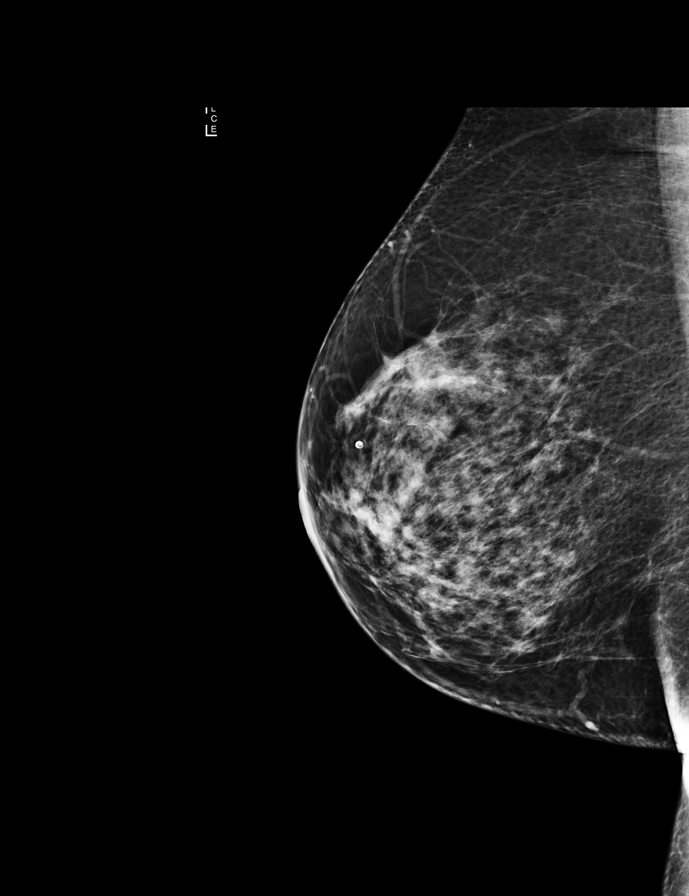

[R CC]
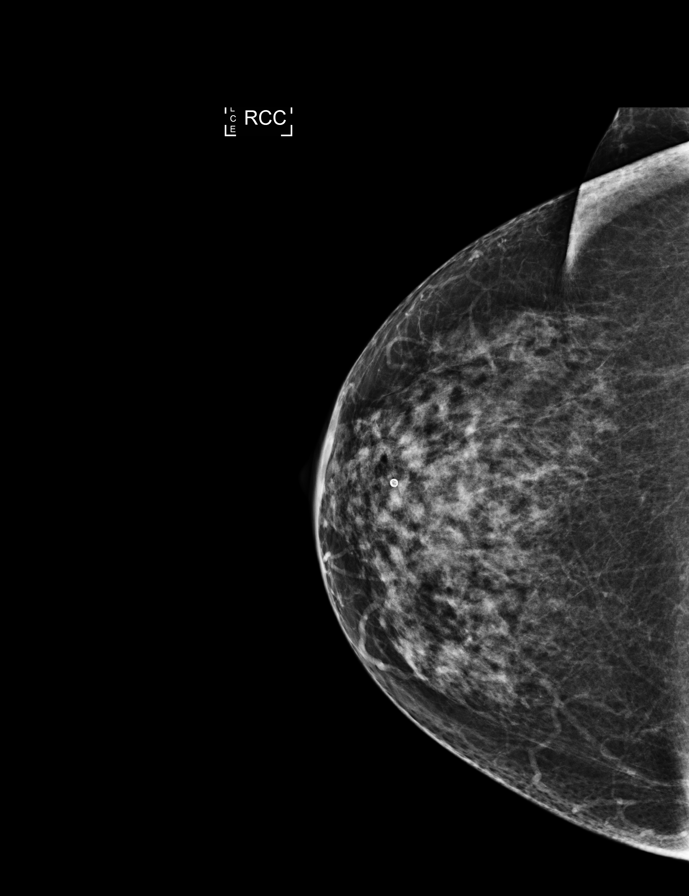

[R CC synth-2D]
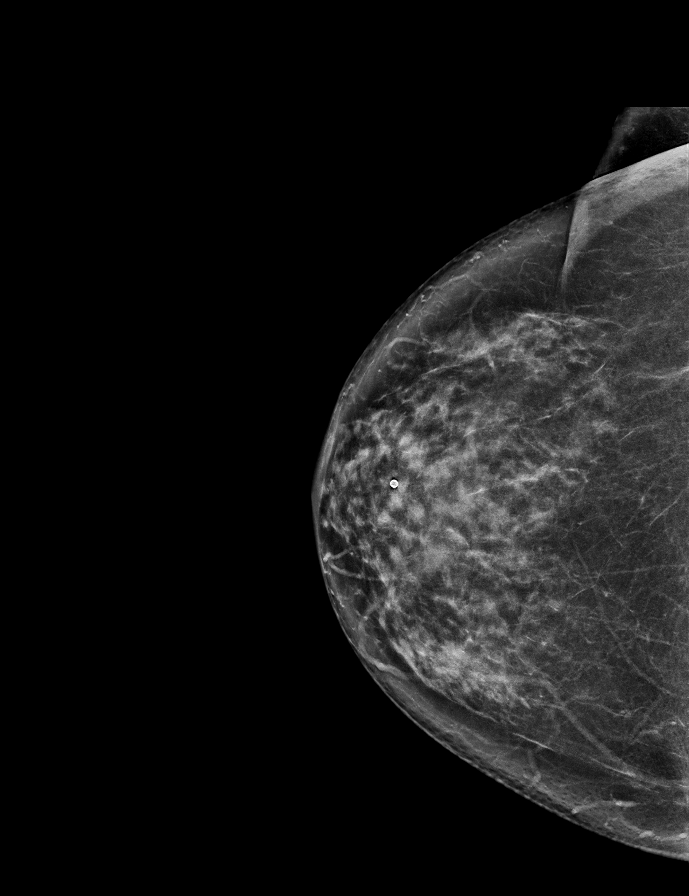

[L CC synth-2D]
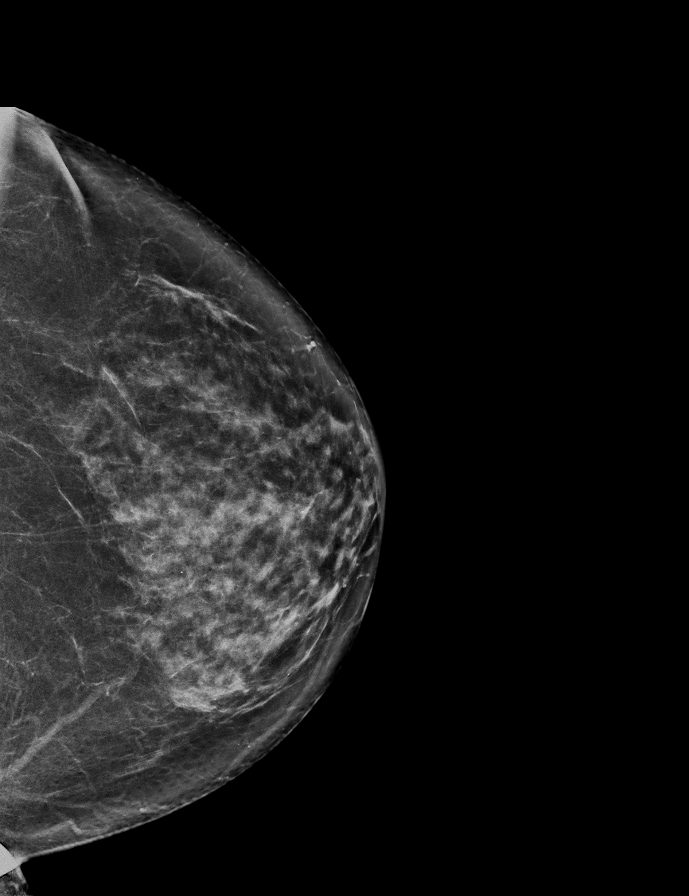

[L CC]
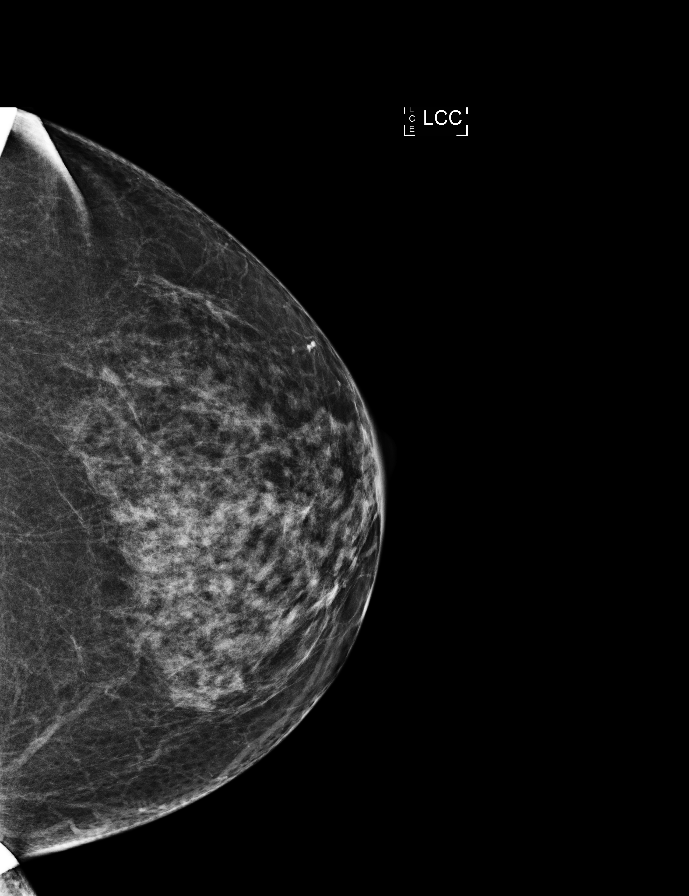

[R MLO synth-2D]
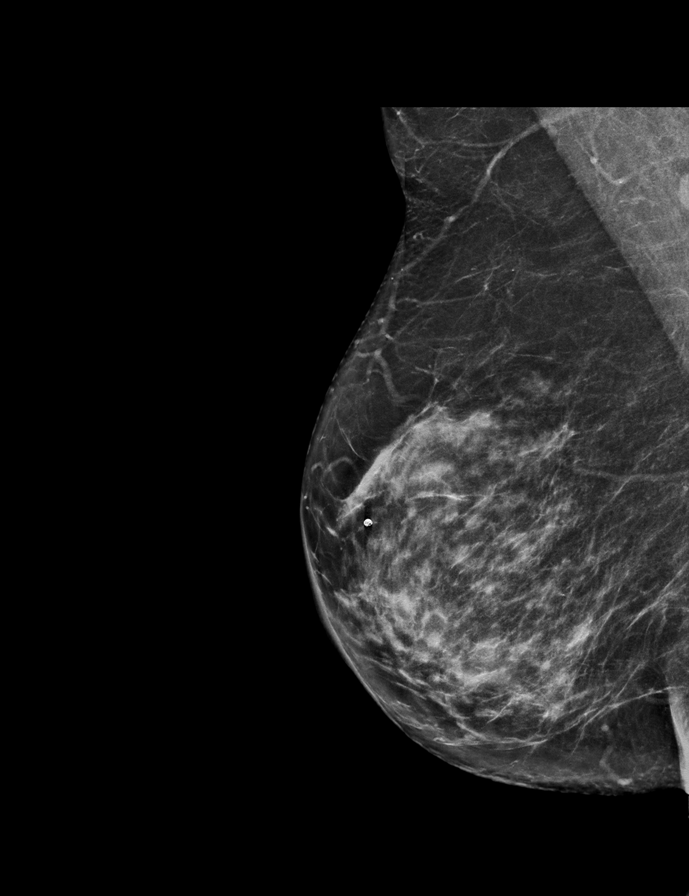

[R MLO (2 of 2)]
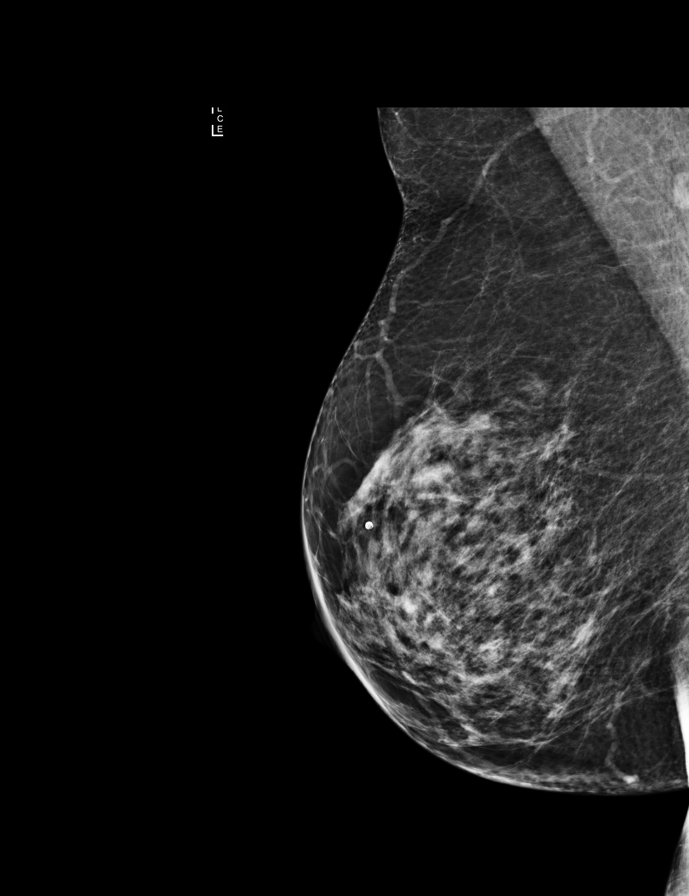

[9 of 31 positions shown; findings below may reference images not displayed]

ACR Breast Density Category c: The breast tissue is heterogeneously
dense, which may obscure small masses
FINDINGS: There are no findings suspicious for malignancy. Images were
processed with CAD.
IMPRESSION: No mammographic evidence of malignancy. A result letter of this
screening mammogram will be mailed directly to the patient.

RECOMMENDATION:
Screening mammogram in one year. (Code:[id number])

BI-RADS CATEGORY  1: Negative.

## 2019-07-10 ENCOUNTER — Encounter: Payer: Self-pay | Admitting: Internal Medicine

## 2019-07-10 ENCOUNTER — Telehealth: Payer: Self-pay | Admitting: Internal Medicine

## 2019-07-10 DIAGNOSIS — B370 Candidal stomatitis: Secondary | ICD-10-CM

## 2019-07-10 MED ORDER — FLUCONAZOLE 150 MG PO TABS: ORAL_TABLET | ORAL | 1 refills | Status: DC

## 2019-07-10 NOTE — Telephone Encounter (Signed)
Spoke with patient for 20 minutes by phone. Is using inhaler which has caused thrush. Has previously used Diflucan qod x 6 with success. Have e-scribed Rx with one refill. Otherwise has scheduled endoscopy and colonscopy in near future. BP stable on amlodipine which has caused some edema and Losartan 100 mg daily. Had laser procedure recently for single varicose vein each leg. Is oxygen dependent due to COPD.

## 2019-09-02 ENCOUNTER — Telehealth: Payer: Self-pay | Admitting: Internal Medicine

## 2019-09-02 ENCOUNTER — Encounter: Payer: Self-pay | Admitting: Internal Medicine

## 2019-09-02 DIAGNOSIS — Z9981 Dependence on supplemental oxygen: Secondary | ICD-10-CM

## 2019-09-02 DIAGNOSIS — J449 Chronic obstructive pulmonary disease, unspecified: Secondary | ICD-10-CM

## 2019-09-02 DIAGNOSIS — Z87440 Personal history of urinary (tract) infections: Secondary | ICD-10-CM

## 2019-09-02 DIAGNOSIS — Z8669 Personal history of other diseases of the nervous system and sense organs: Secondary | ICD-10-CM

## 2019-09-02 DIAGNOSIS — Z87898 Personal history of other specified conditions: Secondary | ICD-10-CM

## 2019-09-02 DIAGNOSIS — I519 Heart disease, unspecified: Secondary | ICD-10-CM

## 2019-09-02 DIAGNOSIS — E7800 Pure hypercholesterolemia, unspecified: Secondary | ICD-10-CM

## 2019-09-02 DIAGNOSIS — I10 Essential (primary) hypertension: Secondary | ICD-10-CM

## 2019-09-02 NOTE — Telephone Encounter (Signed)
Spoke with patient by phone. She is requesting virtual PE visit and evaluation of medical issues. She will see where labs can be drawn near her home and I can fax order to lab facility. Will plan virtual visit for November.

## 2019-09-07 ENCOUNTER — Encounter: Payer: Self-pay | Admitting: Internal Medicine

## 2019-09-16 ENCOUNTER — Ambulatory Visit (INDEPENDENT_AMBULATORY_CARE_PROVIDER_SITE_OTHER): Payer: Medicare Other | Admitting: Internal Medicine

## 2019-09-16 ENCOUNTER — Other Ambulatory Visit: Payer: Self-pay

## 2019-09-16 ENCOUNTER — Encounter: Payer: Self-pay | Admitting: Internal Medicine

## 2019-09-16 VITALS — BP 155/93 | HR 66 | Temp 98.6°F | Ht 63.0 in | Wt 127.8 lb

## 2019-09-16 DIAGNOSIS — J449 Chronic obstructive pulmonary disease, unspecified: Secondary | ICD-10-CM | POA: Diagnosis not present

## 2019-09-16 DIAGNOSIS — I10 Essential (primary) hypertension: Secondary | ICD-10-CM | POA: Diagnosis not present

## 2019-09-16 DIAGNOSIS — Z86010 Personal history of colonic polyps: Secondary | ICD-10-CM

## 2019-09-16 DIAGNOSIS — Z905 Acquired absence of kidney: Secondary | ICD-10-CM

## 2019-09-16 DIAGNOSIS — Z9889 Other specified postprocedural states: Secondary | ICD-10-CM

## 2019-09-16 DIAGNOSIS — Z87440 Personal history of urinary (tract) infections: Secondary | ICD-10-CM

## 2019-09-16 DIAGNOSIS — Z9981 Dependence on supplemental oxygen: Secondary | ICD-10-CM | POA: Diagnosis not present

## 2019-09-16 DIAGNOSIS — Z8669 Personal history of other diseases of the nervous system and sense organs: Secondary | ICD-10-CM

## 2019-09-16 DIAGNOSIS — Z0000 Encounter for general adult medical examination without abnormal findings: Secondary | ICD-10-CM

## 2019-09-16 DIAGNOSIS — E7800 Pure hypercholesterolemia, unspecified: Secondary | ICD-10-CM | POA: Diagnosis not present

## 2019-09-16 MED ORDER — FLUCONAZOLE 150 MG PO TABS: ORAL_TABLET | ORAL | 1 refills | Status: DC
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
## 2019-10-09 ENCOUNTER — Other Ambulatory Visit: Payer: Self-pay

## 2019-10-09 ENCOUNTER — Ambulatory Visit (INDEPENDENT_AMBULATORY_CARE_PROVIDER_SITE_OTHER): Payer: Medicare Other | Admitting: Internal Medicine

## 2019-10-09 DIAGNOSIS — I10 Essential (primary) hypertension: Secondary | ICD-10-CM | POA: Diagnosis not present

## 2019-10-09 MED ORDER — NITROFURANTOIN MONOHYD MACRO 100 MG PO CAPS: 100.0000 mg | ORAL_CAPSULE | Freq: Two times a day (BID) | ORAL | 1 refills | Status: DC

## 2019-10-09 MED ORDER — ATENOLOL 25 MG PO TABS: 25.0000 mg | ORAL_TABLET | Freq: Two times a day (BID) | ORAL | 3 refills | Status: DC

## 2019-10-24 ENCOUNTER — Telehealth (INDEPENDENT_AMBULATORY_CARE_PROVIDER_SITE_OTHER): Payer: Medicare Other | Admitting: Internal Medicine

## 2019-10-24 DIAGNOSIS — N3000 Acute cystitis without hematuria: Secondary | ICD-10-CM | POA: Diagnosis not present

## 2019-10-24 DIAGNOSIS — K5904 Chronic idiopathic constipation: Secondary | ICD-10-CM

## 2019-10-24 MED ORDER — LINACLOTIDE 290 MCG PO CAPS: 290.0000 ug | ORAL_CAPSULE | Freq: Every day | ORAL | 1 refills | Status: DC

## 2019-10-24 NOTE — Telephone Encounter (Signed)
Patient recently treated with macrobid for UTI and improved quickly she said. Started treatment Dec 4 for 10 days. Was not able to get culture done due to lack of availability of leb appts in [place] with Covid-19 pandemic so we treated her without culture.  Now today has recurrent symptoms. Had constipation after recent colonoscopy. Gastroenterologist increased Miralax but that has not helped constipation that much. May benefit from [place] if we can get it approved.

## 2019-11-01 ENCOUNTER — Encounter: Payer: Self-pay | Admitting: Internal Medicine

## 2019-11-01 NOTE — Progress Notes (Signed)
   Subjective:    Patient ID: Wellington Hampshire, female    DOB: Nov 19, 1950, 68 y.o.   MRN: [id number]  HPI 68 year old Female sent message that she was having UTI symptoms. Noted pus in urine and bladder spasms. Reports she is afebrile. Had recent colonoscopy with 2 small precancerous polyps removed and 3 year follow up recommended. Has had some constipations issues since colonoscopy.  We were able to connect via phone today.  She is agreeable to visit in this format today.  She is identified as Dentist. Wind, a patient in this practice using 2 identifiers.  She has a history of recurrent urinary tract infections.  History of recurrent episodes of pyelonephritis onset in childhood.  History of grade 4 ureteral reflux of left kidney and a complicated 3.5 cm cyst.  Underwent partial nephrectomy for large septated cyst that contains a focus of renal cell carcinoma.  Had reimplantation of left ureter in 2007.  No recent urinary infections.  No fever chills nausea or vomiting.    Review of Systems see above-we were unable to get urine specimen today.  She says no appointments were available at lab.  Therefore, when the reviewed previous infections, culture results and treatment regimen.  We decided to proceed with Macrobid.     Objective:   Physical Exam Has reported suprapubic bladder pain and pressure.  Reports being afebrile.  No back pain.       Assessment & Plan:   Acute urinary tract infection  Plan: Macrobid 100 mg twice daily for 10 days.  Call if not better in 48 hours or sooner if worse.

## 2019-11-01 NOTE — Patient Instructions (Signed)
Macrobid 100 mg twice daily for 10 days.  Call if not better in 48 hours or sooner if worse.

## 2019-11-01 NOTE — Patient Instructions (Signed)
It was a pleasure to see you today.  Follow-up in 6 months.

## 2019-11-27 ENCOUNTER — Other Ambulatory Visit: Payer: Self-pay

## 2019-11-27 MED ORDER — ESOMEPRAZOLE MAGNESIUM 40 MG PO CPDR: DELAYED_RELEASE_CAPSULE | ORAL | 3 refills | Status: AC

## 2019-11-27 MED ORDER — HYDROCHLOROTHIAZIDE 12.5 MG PO TABS: 12.5000 mg | ORAL_TABLET | Freq: Every day | ORAL | 3 refills | Status: AC

## 2019-11-27 MED ORDER — ROSUVASTATIN CALCIUM 20 MG PO TABS: 20.0000 mg | ORAL_TABLET | Freq: Every day | ORAL | 3 refills | Status: AC

## 2019-11-27 MED ORDER — LOSARTAN POTASSIUM 100 MG PO TABS: 100.0000 mg | ORAL_TABLET | Freq: Every day | ORAL | 3 refills | Status: AC

## 2019-11-27 MED ORDER — FLUCONAZOLE 150 MG PO TABS: ORAL_TABLET | ORAL | 2 refills | Status: DC

## 2019-11-27 MED ORDER — VALACYCLOVIR HCL 500 MG PO TABS: 500.0000 mg | ORAL_TABLET | Freq: Every day | ORAL | 3 refills | Status: AC

## 2019-12-14 ENCOUNTER — Telehealth (INDEPENDENT_AMBULATORY_CARE_PROVIDER_SITE_OTHER): Payer: Medicare Other | Admitting: Internal Medicine

## 2019-12-14 DIAGNOSIS — R300 Dysuria: Secondary | ICD-10-CM | POA: Diagnosis not present

## 2019-12-14 MED ORDER — NITROFURANTOIN MONOHYD MACRO 100 MG PO CAPS: 100.0000 mg | ORAL_CAPSULE | Freq: Two times a day (BID) | ORAL | 1 refills | Status: DC

## 2019-12-14 NOTE — Telephone Encounter (Signed)
Patient sent My Chart message that she has another UTI responds to Climax. Call in new Rx with one refill. May need to see urologist.

## 2019-12-31 NOTE — Telephone Encounter (Signed)
Spoke with patient on Feb 8th. Linzess has helped constipation but feels she has another UTI. No fever chills nausea or vomiting.Has dysuria and frequency. Will try macrobid once again. Difficult to get urine  culture there. Has to go to [place] at [facility] and has issues getting an appointment.

## 2020-01-04 NOTE — Telephone Encounter (Signed)
Please put in Med List Dulera(Zenhale)  inhaler 100 mcg/80mcg 2 sprays by mouth twice a day

## 2020-01-04 NOTE — Telephone Encounter (Signed)
Have mailed written Rx for Zenhale with prn one year refills to patient

## 2020-01-21 ENCOUNTER — Other Ambulatory Visit: Payer: Self-pay | Admitting: Internal Medicine

## 2020-01-22 ENCOUNTER — Other Ambulatory Visit: Payer: Self-pay

## 2020-01-22 ENCOUNTER — Ambulatory Visit (INDEPENDENT_AMBULATORY_CARE_PROVIDER_SITE_OTHER): Payer: Medicare Other | Admitting: Internal Medicine

## 2020-01-22 ENCOUNTER — Encounter: Payer: Self-pay | Admitting: Internal Medicine

## 2020-01-22 DIAGNOSIS — J449 Chronic obstructive pulmonary disease, unspecified: Secondary | ICD-10-CM

## 2020-01-22 NOTE — Patient Instructions (Signed)
Patient will have a repeat CT scan of the lungs in 3 months.

## 2020-01-22 NOTE — Progress Notes (Addendum)
   Subjective:    Patient ID: [name], female    DOB: April 16, 1951, 69 y.o.   MRN: [id number]  HPI 69 year old Female with history of COPD oxygen dependent who recently had surveillance CT scan of the chest.  We recently received those results.  Today we connected by telephone after interactive audio and video telecommunications failed.  I wanted to review the report with her and discuss options for follow-up.  She was agreeable to visit in this format today and is identified as Occupational hygienist R. Brazell, a patient in this practice.  Patient underwent low-dose computerized tomography of the lungs on January 04, 2020.  Results include severe emphysema of lung parenchyma.  No pneumothorax or pleural effusion.  There are linear opacities with a focal nodular thickening measuring 1.2 x 0.5 cm in the anterior right upper lobe.  Focal pleural-based opacity with coarse calcifications measures 1.2 x 1.0 cm in the medial right upper lobe.  Pleural-based rounded opacity posterior medial left upper lobe measuring 1.0 x 0.9 cm.  Multiple areas of subpleural scarring are seen throughout the remaining lungs.  No bronchiectasis.  No significant abnormality of the thyroid gland.  There is atherosclerotic plaque present in the aorta without aneurysm.  She is on statin medication for atherosclerosis.  She had coronary artery calcifications.  No pericardial effusion.  No mediastinal or axillary lymphadenopathy.  Additional findings included multiple exophytic lesions of the visualized portions of the kidney presumed to represent kidney cysts.  We discussed options including PET scan for short term follow-up in 3 months.  It is felt that these likely represent areas of scarring.  Radiologist noted patient has significant increased risk of pneumothorax if CT-guided biopsy is attempted.  Patient does have a pulmonologist in the [facility] area.  I suggested patient touch base with the pulmonologist for consultation.  After  considerable discussion patient and I agreed she will have repeat CT scan in 3 months to check for stability.    Review of Systems see above-no new complaints except reaction to second COVID-19 vaccine with flulike symptoms for approximately 5 days.     Objective:   Physical Exam Not taken prior to visit but patient reports BP is stable and that she feels well.       Assessment & Plan:  Abnormal Chest CT- patient agrees to re-imaging in 3 months rather than PET scan.  Severe COPD oxygen dependent  HYN- stable  Hyperlipidemia- not discussed  Recurrent UTIs- none recently. Has macrdantin on hand if needed  Constipation- treated with Linzess with good results  Time spent on phone with patient today 20 minutes excluding time spent reviewing lung CT report in advance of visit, but including medical decision making between patient and physician by phone and phone discussion with explanation of abnormal findings to patient.

## 2020-02-24 ENCOUNTER — Other Ambulatory Visit: Payer: Self-pay | Admitting: Internal Medicine

## 2020-07-17 ENCOUNTER — Telehealth: Payer: Self-pay | Admitting: Internal Medicine

## 2020-07-17 ENCOUNTER — Encounter: Payer: Self-pay | Admitting: Internal Medicine

## 2020-07-17 MED ORDER — AMOXICILLIN-POT CLAVULANATE 875-125 MG PO TABS: ORAL_TABLET | ORAL | 1 refills | Status: DC

## 2020-07-17 MED ORDER — NITROFURANTOIN MONOHYD MACRO 100 MG PO CAPS
100.0000 mg | ORAL_CAPSULE | Freq: Two times a day (BID) | ORAL | 0 refills | Status: DC
Start: 2020-07-17 — End: 2020-08-12

## 2020-07-17 NOTE — Telephone Encounter (Signed)
Patient called me last evening around 8 pm. She is idenitified using 2 identifiers as [name], a patient  in this practice. She was at home and I was at my home.   She received a cat bite from another individual's cat on Sept 11 at a cat show at [place], [place]. Cat bit her on right hand - thenar eminence as she was trying to get the cat into it's cage at the cat show. It was trying to escape the cage at the time.   Unfortunately, the pharmacy had closed for the evening when she called.She is going back to the cat show today and will not be home until later in the late afternoon or early evening.   She has a history of recurrent urinary infections also. Needs refill on hand for Macrobid. She will not take Macrobid with Augmentin.  She was also informed that last tetanus vaccine on file is 2010 and she needs an update.  I will continue to follow up with her this week.  MJB.MD

## 2020-08-12 ENCOUNTER — Other Ambulatory Visit: Payer: Self-pay

## 2020-08-12 ENCOUNTER — Encounter: Payer: Self-pay | Admitting: Internal Medicine

## 2020-08-12 ENCOUNTER — Telehealth: Payer: Self-pay | Admitting: Internal Medicine

## 2020-08-12 MED ORDER — AMOXICILLIN-POT CLAVULANATE 875-125 MG PO TABS: ORAL_TABLET | ORAL | 0 refills | Status: AC

## 2020-08-12 NOTE — Telephone Encounter (Signed)
Patient called me today. Patient was bitten by a cat at a cat show on Sept 11. Cat bit her on right hand, thenar eminence.At that time, she was advised to receive a tetanus update at local pharmacy. Augmentin was called in for her 875/125 (10 day course with one refill)  She does not feel that it has completely gotten well. No drainage.Hand at bite site still feels sore deep inside.No fever, No drainage. Have refilled Augmentin for an additional 10 day course today. MJB,MD

## 2020-09-01 ENCOUNTER — Other Ambulatory Visit: Payer: Self-pay | Admitting: Internal Medicine

## 2020-09-20 ENCOUNTER — Other Ambulatory Visit: Payer: Self-pay | Admitting: Internal Medicine

## 2020-10-11 ENCOUNTER — Telehealth: Payer: Self-pay | Admitting: Internal Medicine

## 2020-10-11 NOTE — Telephone Encounter (Signed)
Patient has moved to [facility] and has appointment soon, so she will be having her Medicare Wellness Physical there. Have removed Dr Renold Genta as PCP

## 2021-04-23 ENCOUNTER — Telehealth: Payer: Medicare Other | Admitting: Internal Medicine

## 2021-04-23 ENCOUNTER — Encounter: Payer: Self-pay | Admitting: Internal Medicine

## 2021-04-23 MED ORDER — ATENOLOL 25 MG PO TABS: 25.0000 mg | ORAL_TABLET | Freq: Two times a day (BID) | ORAL | 1 refills | Status: AC

## 2021-04-23 NOTE — Telephone Encounter (Signed)
Phone call from patient. She has been unable to get atenolol refilled by prescriber in [place] for over a week. Have refilled to new pharmacy she is using electronically: [place], [place]
